# Patient Record
Sex: Male | Born: 1960 | Race: White | Hispanic: No | Marital: Married | Smoking: Never smoker
Health system: Southern US, Community
[De-identification: ages and names within clinical notes are randomized; demographics above are authoritative.]

## PROBLEM LIST (undated history)

## (undated) DIAGNOSIS — E119 Type 2 diabetes mellitus without complications: Secondary | ICD-10-CM

## (undated) DIAGNOSIS — T8859XA Other complications of anesthesia, initial encounter: Secondary | ICD-10-CM

## (undated) DIAGNOSIS — N183 Chronic kidney disease, stage 3 unspecified: Secondary | ICD-10-CM

## (undated) DIAGNOSIS — K7460 Unspecified cirrhosis of liver: Secondary | ICD-10-CM

## (undated) DIAGNOSIS — K219 Gastro-esophageal reflux disease without esophagitis: Secondary | ICD-10-CM

## (undated) DIAGNOSIS — I10 Essential (primary) hypertension: Secondary | ICD-10-CM

## (undated) HISTORY — PX: GALLBLADDER SURGERY: SHX652

## (undated) HISTORY — PX: CLUB FOOT RELEASE: SHX1363

## (undated) HISTORY — DX: Essential (primary) hypertension: I10

## (undated) HISTORY — DX: Gastro-esophageal reflux disease without esophagitis: K21.9

---

## 2004-12-27 ENCOUNTER — Emergency Department: Payer: Self-pay | Admitting: Emergency Medicine

## 2005-01-02 ENCOUNTER — Emergency Department: Payer: Self-pay | Admitting: Emergency Medicine

## 2007-02-10 ENCOUNTER — Emergency Department: Payer: Self-pay | Admitting: Emergency Medicine

## 2007-04-01 ENCOUNTER — Ambulatory Visit: Payer: Self-pay | Admitting: Internal Medicine

## 2009-10-28 ENCOUNTER — Emergency Department: Payer: Self-pay | Admitting: Emergency Medicine

## 2010-11-10 ENCOUNTER — Emergency Department: Payer: Self-pay | Admitting: Emergency Medicine

## 2013-01-31 ENCOUNTER — Ambulatory Visit: Payer: Self-pay | Admitting: Internal Medicine

## 2013-10-03 ENCOUNTER — Emergency Department: Payer: Self-pay | Admitting: Emergency Medicine

## 2013-10-03 LAB — BASIC METABOLIC PANEL
Anion Gap: 5 — ABNORMAL LOW (ref 7–16)
BUN: 20 mg/dL — ABNORMAL HIGH (ref 7–18)
Calcium, Total: 9.2 mg/dL (ref 8.5–10.1)
Chloride: 107 mmol/L (ref 98–107)
Co2: 28 mmol/L (ref 21–32)
Creatinine: 0.79 mg/dL (ref 0.60–1.30)
EGFR (Non-African Amer.): >60
Glucose: 93 mg/dL (ref 65–99)
Osmolality: 282 (ref 275–301)
Potassium: 3.6 mmol/L (ref 3.5–5.1)
SODIUM: 140 mmol/L (ref 136–145)

## 2013-10-03 LAB — CBC
HCT: 46.9 % (ref 40.0–52.0)
HGB: 16.1 g/dL (ref 13.0–18.0)
MCH: 29.1 pg (ref 26.0–34.0)
MCHC: 34.3 g/dL (ref 32.0–36.0)
MCV: 85 fL (ref 80–100)
PLATELETS: 178 10*3/uL (ref 150–440)
RBC: 5.52 10*6/uL (ref 4.40–5.90)
RDW: 12.8 % (ref 11.5–14.5)
WBC: 8 10*3/uL (ref 3.8–10.6)

## 2013-10-03 LAB — TROPONIN I: Troponin-I: <0.02 ng/mL

## 2013-11-10 ENCOUNTER — Ambulatory Visit: Payer: Self-pay | Admitting: Oncology

## 2013-11-28 ENCOUNTER — Ambulatory Visit: Payer: Self-pay | Admitting: Oncology

## 2013-12-07 ENCOUNTER — Ambulatory Visit: Payer: Self-pay | Admitting: Oncology

## 2013-12-12 ENCOUNTER — Ambulatory Visit: Payer: Self-pay | Admitting: Oncology

## 2013-12-28 ENCOUNTER — Ambulatory Visit: Payer: Self-pay | Admitting: Oncology

## 2014-07-05 ENCOUNTER — Ambulatory Visit: Payer: Self-pay | Admitting: Oncology

## 2014-07-05 LAB — CBC CANCER CENTER
Basophil #: 0 x10 3/mm (ref 0.0–0.1)
Basophil %: 0.7 %
EOS ABS: 0.2 x10 3/mm (ref 0.0–0.7)
Eosinophil %: 2.7 %
HCT: 47.1 % (ref 40.0–52.0)
HGB: 16.1 g/dL (ref 13.0–18.0)
LYMPHS PCT: 28 %
Lymphocyte #: 1.8 x10 3/mm (ref 1.0–3.6)
MCH: 29 pg (ref 26.0–34.0)
MCHC: 34.2 g/dL (ref 32.0–36.0)
MCV: 85 fL (ref 80–100)
Monocyte #: 0.6 x10 3/mm (ref 0.2–1.0)
Monocyte %: 9.1 %
NEUTROS ABS: 3.9 x10 3/mm (ref 1.4–6.5)
Neutrophil %: 59.5 %
Platelet: 199 x10 3/mm (ref 150–440)
RBC: 5.56 10*6/uL (ref 4.40–5.90)
RDW: 13.1 % (ref 11.5–14.5)
WBC: 6.5 x10 3/mm (ref 3.8–10.6)

## 2014-07-05 LAB — COMPREHENSIVE METABOLIC PANEL
ALBUMIN: 4.3 g/dL (ref 3.4–5.0)
ALK PHOS: 65 U/L
ANION GAP: 9 (ref 7–16)
AST: 23 U/L (ref 15–37)
BUN: 14 mg/dL (ref 7–18)
Bilirubin,Total: 0.8 mg/dL (ref 0.2–1.0)
Calcium, Total: 9.7 mg/dL (ref 8.5–10.1)
Chloride: 103 mmol/L (ref 98–107)
Co2: 31 mmol/L (ref 21–32)
Creatinine: 1.04 mg/dL (ref 0.60–1.30)
EGFR (African American): >60
EGFR (Non-African Amer.): >60
GLUCOSE: 91 mg/dL (ref 65–99)
Osmolality: 285 (ref 275–301)
POTASSIUM: 4.4 mmol/L (ref 3.5–5.1)
SGPT (ALT): 43 U/L
Sodium: 143 mmol/L (ref 136–145)
Total Protein: 7.6 g/dL (ref 6.4–8.2)

## 2014-07-31 ENCOUNTER — Ambulatory Visit: Payer: Self-pay | Admitting: Oncology

## 2014-12-29 ENCOUNTER — Other Ambulatory Visit: Payer: Self-pay | Admitting: *Deleted

## 2014-12-29 DIAGNOSIS — D3A00 Benign carcinoid tumor of unspecified site: Secondary | ICD-10-CM

## 2015-01-04 ENCOUNTER — Other Ambulatory Visit: Payer: Self-pay

## 2015-01-04 ENCOUNTER — Ambulatory Visit: Payer: Self-pay | Admitting: Oncology

## 2015-01-18 ENCOUNTER — Encounter: Payer: Self-pay | Admitting: Oncology

## 2015-01-18 ENCOUNTER — Inpatient Hospital Stay (HOSPITAL_BASED_OUTPATIENT_CLINIC_OR_DEPARTMENT_OTHER): Payer: BC Managed Care – PPO | Admitting: Oncology

## 2015-01-18 ENCOUNTER — Inpatient Hospital Stay: Payer: BC Managed Care – PPO | Attending: Oncology

## 2015-01-18 VITALS — BP 134/84 | HR 60 | Temp 95.9°F | Wt 242.5 lb

## 2015-01-18 DIAGNOSIS — D3A8 Other benign neuroendocrine tumors: Secondary | ICD-10-CM

## 2015-01-18 DIAGNOSIS — D489 Neoplasm of uncertain behavior, unspecified: Secondary | ICD-10-CM | POA: Insufficient documentation

## 2015-01-18 DIAGNOSIS — D3A00 Benign carcinoid tumor of unspecified site: Secondary | ICD-10-CM

## 2015-01-18 LAB — CBC WITH DIFFERENTIAL/PLATELET
Basophils Absolute: 0 10*3/uL (ref 0–0.1)
Basophils Relative: 1 %
EOS ABS: 0.2 10*3/uL (ref 0–0.7)
Eosinophils Relative: 3 %
HCT: 44.9 % (ref 40.0–52.0)
HEMOGLOBIN: 15.5 g/dL (ref 13.0–18.0)
Lymphocytes Relative: 31 %
Lymphs Abs: 1.8 10*3/uL (ref 1.0–3.6)
MCH: 28.9 pg (ref 26.0–34.0)
MCHC: 34.5 g/dL (ref 32.0–36.0)
MCV: 83.7 fL (ref 80.0–100.0)
MONOS PCT: 10 %
Monocytes Absolute: 0.6 10*3/uL (ref 0.2–1.0)
Neutro Abs: 3.3 10*3/uL (ref 1.4–6.5)
Neutrophils Relative %: 55 %
Platelets: 179 10*3/uL (ref 150–440)
RBC: 5.36 MIL/uL (ref 4.40–5.90)
RDW: 13.3 % (ref 11.5–14.5)
WBC: 5.8 10*3/uL (ref 3.8–10.6)

## 2015-01-18 LAB — COMPREHENSIVE METABOLIC PANEL WITH GFR
ALT: 32 U/L (ref 17–63)
AST: 28 U/L (ref 15–41)
Albumin: 4.6 g/dL (ref 3.5–5.0)
Alkaline Phosphatase: 54 U/L (ref 38–126)
Anion gap: 7 (ref 5–15)
BUN: 23 mg/dL — ABNORMAL HIGH (ref 6–20)
CO2: 26 mmol/L (ref 22–32)
Calcium: 9 mg/dL (ref 8.9–10.3)
Chloride: 104 mmol/L (ref 101–111)
Creatinine, Ser: 0.91 mg/dL (ref 0.61–1.24)
GFR calc Af Amer: >60 mL/min
GFR calc non Af Amer: >60 mL/min
Glucose, Bld: 97 mg/dL (ref 65–99)
Potassium: 3.9 mmol/L (ref 3.5–5.1)
Sodium: 137 mmol/L (ref 135–145)
Total Bilirubin: 0.7 mg/dL (ref 0.3–1.2)
Total Protein: 7.5 g/dL (ref 6.5–8.1)

## 2015-01-18 NOTE — Progress Notes (Signed)
Patient does not have living will.  Information given.  Never smoked.

## 2015-01-19 ENCOUNTER — Encounter: Payer: Self-pay | Admitting: Oncology

## 2015-01-19 DIAGNOSIS — D3A8 Other benign neuroendocrine tumors: Secondary | ICD-10-CM | POA: Insufficient documentation

## 2015-01-19 DIAGNOSIS — I10 Essential (primary) hypertension: Secondary | ICD-10-CM | POA: Insufficient documentation

## 2015-01-19 DIAGNOSIS — K7460 Unspecified cirrhosis of liver: Secondary | ICD-10-CM | POA: Insufficient documentation

## 2015-01-19 DIAGNOSIS — E785 Hyperlipidemia, unspecified: Secondary | ICD-10-CM | POA: Insufficient documentation

## 2015-01-19 DIAGNOSIS — T782XXA Anaphylactic shock, unspecified, initial encounter: Secondary | ICD-10-CM | POA: Insufficient documentation

## 2015-01-19 NOTE — Progress Notes (Signed)
[place] @ [facility] [phone]  Fax:([phone]) [place]: 08/22/1960  MR#: [id number]  SWH#:[id number]  Patient Care Team: Adin Hector, MD as PCP - General (Internal Medicine)  CHIEF COMPLAINT:  Chief Complaint  Patient presents with  . Follow-up     No history exists.   1.neuroendocrine tumor, well  differentiated. 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  Patient is getting regular upper endoscopy done.  No significant new symptoms.   REVIEW OF SYSTEMS:   GENERAL:  Feels good.  Active.  No fevers, sweats or weight loss. PERFORMANCE STATUS (ECOG): 0 HEENT:  No visual changes, runny nose, sore throat, mouth sores or tenderness. Lungs: No shortness of breath or cough.  No hemoptysis. Cardiac:  No chest pain, palpitations, orthopnea, or PND. GI:  No nausea, vomiting, diarrhea, constipation, melena or hematochezia. GU:  No urgency, frequency, dysuria, or hematuria. Musculoskeletal:  No back pain.  No joint pain.  No muscle tenderness. Extremities:  No pain or swelling. Skin:  No rashes or skin changes. Neuro:  No headache, numbness or weakness, balance or coordination issues. Endocrine:  No diabetes, thyroid issues, hot flashes or night sweats. Psych:  No mood changes, depression or anxiety. Pain:  No focal pain. Review of systems:  All other systems reviewed and found to be negative. As per HPI. Otherwise, a complete review of systems is negatve.   Allergies:  Prednisone: Unknown  Tetracycline: Unknown  Bee Stings: Unknown  Significant History/PMH:   Hypertension:    Foot Surgery - Left: REPAIR OF CLUB FOOT AS CHILD   Cholecystectomy:   Smoking History: Smoking History Never Smoked.(1)  PFSH: Comments: aunt had breast cancer  . father had lung cancer  Social History: negative  alcohol, negative tobacco  Comments: He is a Theme park manager in a [employer]  Additional Past Medical and Surgical History: As mentioned above   No family history on file.  ADVANCED DIRECTIVES:  Patient does not have any living will or healthcare power of attorney.  Information was given .  Available resources had been discussed.  We will follow-up on subsequent appointments regarding this issue HEALTH MAINTENANCE: History  Substance Use Topics  . Smoking status: Never Smoker   . Smokeless tobacco: Not on file  . Alcohol Use: Not on file      Allergies  Allergen Reactions  . Prednisone Other (See Comments)  . Tetracycline Other (See Comments)    Current Outpatient Prescriptions  Medication Sig Dispense Refill  . hydrochlorothiazide (HYDRODIURIL) 25 MG tablet TAKE 1 TABLET BY MOUTH DAILY    . lisinopril (PRINIVIL,ZESTRIL) 10 MG tablet TAKE 1 TABLET BY MOUTH EVERY DAY    . pantoprazole (PROTONIX) 40 MG tablet TAKE ONE TABLET EVERY DAY     No current facility-administered medications for this visit.    OBJECTIVE:  Filed Vitals:   01/18/15 1046  BP: 134/84  Pulse: 60  Temp: 95.9 F (35.5 C)     There is no height on file to calculate BMI.    ECOG FS:0 - Asymptomatic  PHYSICAL EXAM: General status: Performance status is good.  Patient has not lost significant weight HEENT: No evidence of stomatitis. Sclera and conjunctivae :: No jaundice.   pale looking. Lungs: Air  entry equal on both sides.  No rhonchi.  No rales.  Cardiac: Heart sounds  are normal.  No pericardial rub.  No murmur. Lymphatic system: Cervical, axillary, inguinal, lymph nodes not palpable GI: Abdomen is soft.  No ascites.  Liver spleen not palpable.  No tenderness.  Bowel sounds are within normal limit Lower extremity: No edema Neurological system: Higher functions, cranial nerves intact no evidence of peripheral neuropathy. Skin: No rash.  No ecchymosis.Marland Kitchen   LAB RESULTS:  Appointment on 01/18/2015    Component Date Value Ref Range Status  . WBC 01/18/2015 5.8  3.8 - 10.6 K/uL Final  . RBC 01/18/2015 5.36  4.40 - 5.90 MIL/uL Final  . Hemoglobin 01/18/2015 15.5  13.0 - 18.0 g/dL Final  . HCT 01/18/2015 44.9  40.0 - 52.0 % Final  . MCV 01/18/2015 83.7  80.0 - 100.0 fL Final  . MCH 01/18/2015 28.9  26.0 - 34.0 pg Final  . MCHC 01/18/2015 34.5  32.0 - 36.0 g/dL Final  . RDW 01/18/2015 13.3  11.5 - 14.5 % Final  . Platelets 01/18/2015 179  150 - 440 K/uL Final  . Neutrophils Relative % 01/18/2015 55   Final  . Neutro Abs 01/18/2015 3.3  1.4 - 6.5 K/uL Final  . Lymphocytes Relative 01/18/2015 31   Final  . Lymphs Abs 01/18/2015 1.8  1.0 - 3.6 K/uL Final  . Monocytes Relative 01/18/2015 10   Final  . Monocytes Absolute 01/18/2015 0.6  0.2 - 1.0 K/uL Final  . Eosinophils Relative 01/18/2015 3   Final  . Eosinophils Absolute 01/18/2015 0.2  0 - 0.7 K/uL Final  . Basophils Relative 01/18/2015 1   Final  . Basophils Absolute 01/18/2015 0.0  0 - 0.1 K/uL Final  . Sodium 01/18/2015 137  135 - 145 mmol/L Final  . Potassium 01/18/2015 3.9  3.5 - 5.1 mmol/L Final  . Chloride 01/18/2015 104  101 - 111 mmol/L Final  . CO2 01/18/2015 26  22 - 32 mmol/L Final  . Glucose, Bld 01/18/2015 97  65 - 99 mg/dL Final  . BUN 01/18/2015 23* 6 - 20 mg/dL Final  . Creatinine, Ser 01/18/2015 0.91  0.61 - 1.24 mg/dL Final  . Calcium 01/18/2015 9.0  8.9 - 10.3 mg/dL Final  . Total Protein 01/18/2015 7.5  6.5 - 8.1 g/dL Final  . Albumin 01/18/2015 4.6  3.5 - 5.0 g/dL Final  . AST 01/18/2015 28  15 - 41 U/L Final  . ALT 01/18/2015 32  17 - 63 U/L Final  . Alkaline Phosphatase 01/18/2015 54  38 - 126 U/L Final  . Total Bilirubin 01/18/2015 0.7  0.3 - 1.2 mg/dL Final  . GFR calc non Af Amer 01/18/2015 >60  >60 mL/min Final  . GFR calc Af Amer 01/18/2015 >60  >60 mL/min Final   Comment: (NOTE) The eGFR has been calculated using the CKD EPI equation. This calculation has not been validated in all clinical  situations. eGFR's persistently <60 mL/min signify possible Chronic Kidney Disease.   . Anion gap 01/18/2015 7  5 - 15 Final       ASSESSMENT: Stage I neuroendocrine tumor.  Without any carcinoid syndrome symptoms  MEDICAL DECISION MAKING:  All lab data is within normal limits.  At this point in time considering very early stage of looking tumor patient does not require any further imaging or specific blood studies or urine studies unless patient develops any carcinoid syndrome. Patient would be followed regularly by primary care physician and will be discharged from our care  Patient expressed understanding and was in agreement with  this plan. He also understands that He can call clinic at any time with any questions, concerns, or complaints.    Neuroendocrine tumor   Staging form: Colon and Rectum, AJCC 7th Edition     Clinical: Stage I (T1, N0, M0) - Signed by Forest Gleason, MD on 01/19/2015   Forest Gleason, MD   01/19/2015 2:51 PM

## 2015-11-19 DIAGNOSIS — G8929 Other chronic pain: Secondary | ICD-10-CM | POA: Insufficient documentation

## 2015-11-19 DIAGNOSIS — M79671 Pain in right foot: Secondary | ICD-10-CM

## 2016-02-13 DIAGNOSIS — R739 Hyperglycemia, unspecified: Secondary | ICD-10-CM | POA: Insufficient documentation

## 2016-05-30 ENCOUNTER — Ambulatory Visit (INDEPENDENT_AMBULATORY_CARE_PROVIDER_SITE_OTHER): Payer: BC Managed Care – PPO | Admitting: Urology

## 2016-05-30 ENCOUNTER — Other Ambulatory Visit: Payer: Self-pay | Admitting: Family Medicine

## 2016-05-30 ENCOUNTER — Encounter: Payer: Self-pay | Admitting: Urology

## 2016-05-30 VITALS — BP 129/79 | HR 77 | Ht 72.0 in | Wt 242.0 lb

## 2016-05-30 DIAGNOSIS — R972 Elevated prostate specific antigen [PSA]: Secondary | ICD-10-CM

## 2016-05-30 DIAGNOSIS — R350 Frequency of micturition: Secondary | ICD-10-CM

## 2016-05-30 NOTE — Progress Notes (Signed)
05/30/2016 3:03 PM   [place] [birth year] [id number]  Referring provider: Adin Hector, MD [place], [place] [address]  Chief Complaint  Patient presents with  . Elevated PSA    HPI: 55 year old [place] referred for further evaluation of elevated PSA. His most recent PSA  was 4.03 on 05/08/2016.  PSA trend is below: 4.03 05/18/2016 3.27 08/09/2015 4.0  02/01/2015 3.11 01/18/2014  No family history of prostate cancer.  He denies any baseline urinary symptoms other than urinary frequency which started just after starting lisinopril and hydrochlorothiazide. He denies any other significant urinary symptoms including no dysuria, gross hematuria, or UTIs.  PMHx significant for HTN, neuroendocrine tumor of the duodenal bulb.        IPSS    Row Name 05/30/16 1400         International Prostate Symptom Score   How often have you had the sensation of not emptying your bladder? Not at All     How often have you had to urinate less than every two hours? More than half the time     How often have you found you stopped and started again several times when you urinated? Not at All     How often have you found it difficult to postpone urination? Less than half the time     How often have you had a weak urinary stream? Not at All     How often have you had to strain to start urination? Not at All     How many times did you typically get up at night to urinate? 1 Time     Total IPSS Score 7       Quality of Life due to urinary symptoms   If you were to spend the rest of your life with your urinary condition just the way it is now how would you feel about that? Pleased        Score:  1-7 Mild 8-19 Moderate 20-35 Severe   PMH: Past Medical History:  Diagnosis Date  . GERD (gastroesophageal reflux disease)   . Hypertension     Surgical History: Past Surgical History:  Procedure Laterality Date  . CLUB FOOT RELEASE    .  GALLBLADDER SURGERY      Home Medications:    Medication List       Accurate as of 05/30/16  3:03 PM. Always use your most recent med list.          hydrochlorothiazide 25 MG tablet Commonly known as:  HYDRODIURIL TAKE 1 TABLET BY MOUTH DAILY   lisinopril 10 MG tablet Commonly known as:  PRINIVIL,ZESTRIL TAKE 1 TABLET BY MOUTH EVERY DAY   pantoprazole 40 MG tablet Commonly known as:  PROTONIX TAKE ONE TABLET EVERY DAY       Allergies:  Allergies  Allergen Reactions  . Prednisone Other (See Comments)  . Tetracycline Other (See Comments)    Family History: Family History  Problem Relation Age of Onset  . Prostate cancer Neg Hx   . Bladder Cancer Neg Hx   . Kidney cancer Neg Hx     Social History:  reports that he has never smoked. He has never used smokeless tobacco. He reports that he does not drink alcohol or use drugs.  ROS: UROLOGY Frequent Urination?: Yes Hard to postpone urination?: No Burning/pain with urination?: No Get up at night to urinate?: Yes Leakage of urine?: No Urine stream starts and  stops?: No Trouble starting stream?: No Do you have to strain to urinate?: No Blood in urine?: No Urinary tract infection?: No Sexually transmitted disease?: No Injury to kidneys or bladder?: No Painful intercourse?: No Weak stream?: No Erection problems?: No Penile pain?: No  Gastrointestinal Nausea?: No Vomiting?: No Indigestion/heartburn?: Yes Diarrhea?: No Constipation?: No  Constitutional Fever: No Night sweats?: No Weight loss?: No Fatigue?: No  Skin Skin rash/lesions?: No Itching?: No  Eyes Blurred vision?: No Double vision?: No  Ears/Nose/Throat Sore throat?: No Sinus problems?: No  Hematologic/Lymphatic Swollen glands?: No Easy bruising?: No  Cardiovascular Leg swelling?: No Chest pain?: No  Respiratory Cough?: No Shortness of breath?: No  Endocrine Excessive thirst?: No  Musculoskeletal Back pain?: No Joint  pain?: No  Neurological Headaches?: No Dizziness?: No  Psychologic Depression?: No Anxiety?: No  Physical Exam: BP 129/79   Pulse 77   Ht 6' (1.829 m)   Wt 242 lb (109.8 kg)   BMI 32.82 kg/m   Constitutional:  Alert and oriented, No acute distress. HEENT: [place] AT, moist mucus membranes.  Trachea midline, no masses. Cardiovascular: No clubbing, cyanosis, or edema. Respiratory: Normal respiratory effort, no increased work of breathing. GI: Abdomen is soft, nontender, nondistended, no abdominal masses GU: No CVA tenderness.  Rectal: Normal sphincter tone. Enlarged prostate with a rubbery lateral lobes, 50+ cc gland. Skin: No rashes, bruises or suspicious lesions. Neurologic: Grossly intact, no focal deficits, moving all 4 extremities. Psychiatric: Normal mood and affect.  Laboratory Data: Lab Results  Component Value Date   WBC 5.8 01/18/2015   HGB 15.5 01/18/2015   HCT 44.9 01/18/2015   MCV 83.7 01/18/2015   PLT 179 01/18/2015    Lab Results  Component Value Date   CREATININE 0.91 01/18/2015    PSA as above  Urinalysis No results found for: COLORURINE, APPEARANCEUR, LABSPEC, PHURINE, GLUCOSEU, HGBUR, BILIRUBINUR, KETONESUR, PROTEINUR, UROBILINOGEN, NITRITE, LEUKOCYTESUR  Pertinent Imaging: n/a  Assessment & Plan:    1. Elevated PSA  We reviewed the implications of an elevated PSA and the uncertainty surrounding it. In general, a man's PSA increases with age and is produced by both normal and cancerous prostate tissue. Differential for elevated PSA is BPH, prostate cancer, infection, recent intercourse/ejaculation, prostate infarction, recent urethroscopic manipulation (foley placement/cystoscopy) and prostatitis. Management of an elevated PSA can include observation or prostate biopsy and wediscussed this in detail.  We discussed that indications for prostate biopsy are defined by age and race specific PSA cutoffs as well as a PSA velocity of 0.75/year.  Overall,  his PSA is somewhat more elevated than I would expect for his age and ethnicity. Trend is somewhat up-and-down, relatively stable overall.  Based on his gland size on exam, this may be attributed to an enlarged prostate. I have recommended repeat PSA with free PSA today.  We discussed the possibility of proceeding with prostate biopsy. Alternatively, consider continuing to follow his lab values closely, repeat PSA/DRE in 6 months which he would prefer. He is agreeable with this plan.   - PSA, total and free  2. Urinary frequency Mild daytime urinary frequency related to medications   Return in about 6 months (around 11/28/2016) for PSA/ DRE.  Hollice Espy, MD  [facility] [address] [place], [place] [address] [phone]

## 2016-06-02 ENCOUNTER — Other Ambulatory Visit: Payer: Self-pay

## 2016-06-02 ENCOUNTER — Other Ambulatory Visit
Admission: RE | Admit: 2016-06-02 | Discharge: 2016-06-02 | Disposition: A | Discharge status: Home / Self Care | Payer: BC Managed Care – PPO | Source: Ambulatory Visit | Attending: Urology | Admitting: Urology

## 2016-06-02 DIAGNOSIS — R972 Elevated prostate specific antigen [PSA]: Secondary | ICD-10-CM

## 2016-06-03 ENCOUNTER — Telehealth: Payer: Self-pay

## 2016-06-03 LAB — PSA (REFLEX TO FREE) (SERIAL): Prostate Specific Ag, Serum: 3.8 ng/mL (ref 0.0–4.0)

## 2016-06-03 NOTE — Telephone Encounter (Signed)
-----   Message from Hollice Espy, MD sent at 06/03/2016  1:12 PM EST ----- PSA down to 3.8.  See you in 6 months.    Hollice Espy, MD

## 2016-06-03 NOTE — Telephone Encounter (Signed)
Spoke with pt in reference to PSA results. Pt voiced understanding.  

## 2016-11-10 ENCOUNTER — Telehealth: Payer: Self-pay | Admitting: Urology

## 2016-11-10 NOTE — Telephone Encounter (Signed)
Called patient to move his app in [place] on 07-31-16 because you are not seeing patient's in [place] that day.I offered him an app in [place] but he declined. Patient said that he was told to come back after he had a follow up app with Dr. Caryl Comes and that is not until 11-18-16. He did not want to schedule another app right now. He said he would call back later to schedule after he has this app.  Thanks,  Sharyn Lull

## 2016-11-28 ENCOUNTER — Ambulatory Visit: Payer: BC Managed Care – PPO | Admitting: Urology

## 2017-04-02 ENCOUNTER — Other Ambulatory Visit: Payer: Self-pay

## 2017-04-02 DIAGNOSIS — R972 Elevated prostate specific antigen [PSA]: Secondary | ICD-10-CM

## 2017-04-03 ENCOUNTER — Encounter: Payer: Self-pay | Admitting: Urology

## 2017-04-03 ENCOUNTER — Ambulatory Visit (INDEPENDENT_AMBULATORY_CARE_PROVIDER_SITE_OTHER): Payer: BC Managed Care – PPO | Admitting: Urology

## 2017-04-03 ENCOUNTER — Other Ambulatory Visit
Admission: RE | Admit: 2017-04-03 | Discharge: 2017-04-03 | Disposition: A | Discharge status: Home / Self Care | Payer: BC Managed Care – PPO | Source: Ambulatory Visit | Attending: Urology | Admitting: Urology

## 2017-04-03 VITALS — BP 137/74 | HR 61 | Ht 72.0 in | Wt 240.0 lb

## 2017-04-03 DIAGNOSIS — R972 Elevated prostate specific antigen [PSA]: Secondary | ICD-10-CM

## 2017-04-03 DIAGNOSIS — R350 Frequency of micturition: Secondary | ICD-10-CM

## 2017-04-03 NOTE — Progress Notes (Signed)
04/03/2017 4:13 PM   [place] Mare 19-Jan-1961 [id number]  Referring provider: Adin Hector, MD [place] [facility] [place], [place] [address]  Chief Complaint  Patient presents with  . Elevated PSA    HPI: 56 year old male referred for further evaluation of elevated PSA who returns for routine follow up.  PSA trend is below: 6.52  03/05/2017 4.03 05/18/2016 3.27 08/09/2015 4.0  02/01/2015 3.11 01/18/2014  No family history of prostate cancer.  He denies any baseline urinary symptoms other than urinary frequency which started just after starting lisinopril and hydrochlorothiazide. He denies any other significant urinary symptoms including no dysuria, gross hematuria, or UTIs.  He reports having issues with discomfort int he penile and scrotal area in July and again over the past few weeks.    PMHx significant for HTN, neuroendocrine tumor of the duodenal bulb.     PMH: Past Medical History:  Diagnosis Date  . GERD (gastroesophageal reflux disease)   . Hypertension     Surgical History: Past Surgical History:  Procedure Laterality Date  . CLUB FOOT RELEASE    . GALLBLADDER SURGERY      Home Medications:  Allergies as of 04/03/2017      Reactions   Prednisone Other (See Comments)   Tetracycline Other (See Comments)      Medication List       Accurate as of 04/03/17 11:59 PM. Always use your most recent med list.          hydrochlorothiazide 25 MG tablet Commonly known as:  HYDRODIURIL TAKE 1 TABLET BY MOUTH DAILY   lisinopril 10 MG tablet Commonly known as:  PRINIVIL,ZESTRIL TAKE 1 TABLET BY MOUTH EVERY DAY   pantoprazole 40 MG tablet Commonly known as:  PROTONIX TAKE ONE TABLET EVERY DAY       Allergies:  Allergies  Allergen Reactions  . Prednisone Other (See Comments)  . Tetracycline Other (See Comments)    Family History: Family History  Problem Relation Age of Onset  . Prostate cancer Neg Hx   . Bladder  Cancer Neg Hx   . Kidney cancer Neg Hx     Social History:  reports that he has never smoked. He has never used smokeless tobacco. He reports that he does not drink alcohol or use drugs.  ROS: UROLOGY Frequent Urination?: No Hard to postpone urination?: No Burning/pain with urination?: No Get up at night to urinate?: No Leakage of urine?: No Urine stream starts and stops?: No Trouble starting stream?: No Do you have to strain to urinate?: No Blood in urine?: No Urinary tract infection?: No Sexually transmitted disease?: No Injury to kidneys or bladder?: No Painful intercourse?: No Weak stream?: No Erection problems?: No Penile pain?: Yes  Gastrointestinal Nausea?: No Vomiting?: No Indigestion/heartburn?: No Diarrhea?: No Constipation?: No  Constitutional Fever: No Night sweats?: No Weight loss?: No Fatigue?: No  Skin Skin rash/lesions?: No Itching?: No  Eyes Blurred vision?: No Double vision?: No  Ears/Nose/Throat Sore throat?: No Sinus problems?: No  Hematologic/Lymphatic Swollen glands?: No Easy bruising?: No  Cardiovascular Leg swelling?: No Chest pain?: No  Respiratory Cough?: No Shortness of breath?: No  Endocrine Excessive thirst?: No  Musculoskeletal Back pain?: Yes Joint pain?: No  Neurological Headaches?: No Dizziness?: No  Psychologic Depression?: No Anxiety?: No  Physical Exam: BP 137/74   Pulse 61   Ht 6' (1.829 m)   Wt 240 lb (108.9 kg)   BMI 32.55 kg/m   Constitutional:  Alert  and oriented, No acute distress. HEENT: [place] AT, moist mucus membranes.  Trachea midline, no masses. Cardiovascular: No clubbing, cyanosis, or edema. Respiratory: Normal respiratory effort, no increased work of breathing. GI: Abdomen is soft, nontender, nondistended, no abdominal masses GU: No CVA tenderness.  Rectal: Normal sphincter tone. Enlarged prostate with a rubbery lateral lobes, 50+ cc gland. Skin: No rashes, bruises or suspicious  lesions. Neurologic: Grossly intact, no focal deficits, moving all 4 extremities. Psychiatric: Normal mood and affect.  Laboratory Data: Lab Results  Component Value Date   WBC 5.8 01/18/2015   HGB 15.5 01/18/2015   HCT 44.9 01/18/2015   MCV 83.7 01/18/2015   PLT 179 01/18/2015    Lab Results  Component Value Date   CREATININE 0.91 01/18/2015    PSA as above  Urinalysis N/a  Pertinent Imaging: n/a  Assessment & Plan:    1. Elevated PSA Repeat PSA ordered today given that he was having symptoms around the time of his most recent PSA and may reflect PSA elevation related to inflammation.  Will call with results and plan based on labs  Given his age, low threshold to pursue biopsy if PSA continues to rise. Patient understands and is agreeable with this plan.  - PSA, total and free  2. Urinary frequency Mild daytime urinary frequency related to medications  Will call with PSA results to devise plan.  Hollice Espy, MD  [facility] [place], [place] [address] [phone]

## 2017-04-04 LAB — PSA (REFLEX TO FREE) (SERIAL): PROSTATE SPECIFIC AG, SERUM: 4.8 ng/mL — AB (ref 0.0–4.0)

## 2017-04-04 LAB — FPSA% REFLEX
% FREE PSA: 8.5 %
PSA, FREE: 0.41 ng/mL

## 2017-04-06 ENCOUNTER — Telehealth: Payer: Self-pay | Admitting: Urology

## 2017-04-06 NOTE — Telephone Encounter (Signed)
Pt returned your call.  Please call him on his cell# [phone].

## 2017-04-06 NOTE — Telephone Encounter (Signed)
Patient to discuss PSA results. Able to leave a message on the machine outlining plan.  PSA down to 4.8, but overall, trend is concerning and additionally, free PSA is quite low putting him at a high risk category. As such, I have recommended proceeding biopsy which we discussed in the office.  Please mail him biopsy instruction sheet and scheduled biopsy.  Hollice Espy, MD

## 2017-04-08 NOTE — Telephone Encounter (Signed)
Yes, discussed at length.  Please mail him instruction sheet as well.    Hollice Espy, MD

## 2017-04-08 NOTE — Telephone Encounter (Signed)
Before I call him to schedule this were you able to reach him and discuss results?   Thanks, Sharyn Lull

## 2017-04-09 ENCOUNTER — Telehealth: Payer: Self-pay

## 2017-04-09 MED ORDER — DIAZEPAM 10 MG PO TABS: 10.0000 mg | ORAL_TABLET | Freq: Once | ORAL | 0 refills | Status: AC

## 2017-04-09 NOTE — Telephone Encounter (Signed)
Spoke with patient and went over biopsy instructions over the phone mailed them and  script for valium.  Sharyn Lull

## 2017-04-09 NOTE — Telephone Encounter (Signed)
Patient requesting a Valium for his upcoming prostate biopsy, instructions were discussed with patient.

## 2017-05-01 ENCOUNTER — Other Ambulatory Visit: Payer: Self-pay | Admitting: Urology

## 2017-05-01 ENCOUNTER — Ambulatory Visit (INDEPENDENT_AMBULATORY_CARE_PROVIDER_SITE_OTHER): Payer: BC Managed Care – PPO | Admitting: Urology

## 2017-05-01 ENCOUNTER — Encounter: Payer: Self-pay | Admitting: Urology

## 2017-05-01 VITALS — BP 144/94 | HR 61 | Ht 72.0 in | Wt 241.0 lb

## 2017-05-01 DIAGNOSIS — R972 Elevated prostate specific antigen [PSA]: Secondary | ICD-10-CM

## 2017-05-01 MED ORDER — LEVOFLOXACIN 500 MG PO TABS
500.0000 mg | ORAL_TABLET | Freq: Once | ORAL | Status: AC
  Administered 2017-05-01: 500 mg via ORAL

## 2017-05-01 MED ORDER — LIDOCAINE HCL 2 % EX GEL
1.0000 "application " | Freq: Once | CUTANEOUS | Status: AC
  Administered 2017-05-01: 1 via URETHRAL

## 2017-05-01 MED ORDER — GENTAMICIN SULFATE 40 MG/ML IJ SOLN
80.0000 mg | Freq: Once | INTRAMUSCULAR | Status: AC
  Administered 2017-05-01: 80 mg via INTRAMUSCULAR

## 2017-05-01 NOTE — Progress Notes (Signed)
   05/01/17  CC:  Chief Complaint  Patient presents with  . Biopsy    HPI: 56 year old male with elevated PSA who presents today for prostate biopsy.  Blood pressure (!) 144/94, pulse 61, height 6' (1.829 m), weight 241 lb (109.3 kg). NED. A&Ox3.   No respiratory distress   Abd soft, NT, ND Normal sphincter tone  Prostate Biopsy Procedure   Informed consent was obtained after discussing risks/benefits of the procedure.  A time out was performed to ensure correct patient identity.  Pre-Procedure: - Gentamicin given prophylactically - Levaquin 500 mg administered PO -Transrectal Ultrasound performed revealing a 46.8 gm prostate -Small amount of intravesical protrusion.  Multiple calcification throughout the entire bladder.  Very small hypoechoic lesion at right lateral base.  Procedure: - Prostate block performed using 10 cc 1% lidocaine and biopsies taken from sextant areas, a total of 12 under ultrasound guidance.  Post-Procedure: - Patient tolerated the procedure well - He was counseled to seek immediate medical attention if experiences any severe pain, significant bleeding, or fevers - Return in one week to discuss biopsy results  Assessment/ Plan:   1. Elevated PSA Status post uncomplicated biopsy - gentamicin (GARAMYCIN) injection 80 mg; Inject 2 mLs (80 mg total) into the muscle once. - levofloxacin (LEVAQUIN) tablet 500 mg; Take 1 tablet (500 mg total) by mouth once. - lidocaine (XYLOCAINE) 2 % jelly 1 application; Place 1 application into the urethra once.  F/u as scheduled in 2 weeks  Hollice Espy, MD

## 2017-05-06 ENCOUNTER — Other Ambulatory Visit: Payer: Self-pay | Admitting: Urology

## 2017-05-06 LAB — PATHOLOGY REPORT

## 2017-05-08 ENCOUNTER — Telehealth: Payer: Self-pay

## 2017-05-08 DIAGNOSIS — R972 Elevated prostate specific antigen [PSA]: Secondary | ICD-10-CM

## 2017-05-08 NOTE — Telephone Encounter (Signed)
-----   Message from Hollice Espy, MD sent at 05/07/2017 11:47 AM EST ----- Awesome news, no evidence of prostate cancer on this biopsy.  There was evidence of inflammation as well as an enlarged prostate which may be the cause for the PSA rise.  I would like to continue to follow you closely, recommend rescheduling follow-up for in 6 months with PSA prior.  Hollice Espy, MD

## 2017-05-08 NOTE — Telephone Encounter (Signed)
Spoke with pt in reference to -bx results and needing a f/u in 62mo with PSA prior. Pt voiced understanding. Lab orders placed. Lab and OV appts made.

## 2017-05-15 ENCOUNTER — Ambulatory Visit: Payer: BC Managed Care – PPO | Admitting: Urology

## 2017-11-09 ENCOUNTER — Other Ambulatory Visit: Payer: BC Managed Care – PPO

## 2017-11-09 DIAGNOSIS — R972 Elevated prostate specific antigen [PSA]: Secondary | ICD-10-CM

## 2017-11-10 ENCOUNTER — Encounter: Payer: Self-pay | Admitting: Urology

## 2017-11-10 ENCOUNTER — Ambulatory Visit: Payer: BC Managed Care – PPO | Admitting: Urology

## 2017-11-10 VITALS — BP 121/80 | HR 67 | Resp 16 | Ht 72.0 in | Wt 241.8 lb

## 2017-11-10 DIAGNOSIS — R972 Elevated prostate specific antigen [PSA]: Secondary | ICD-10-CM

## 2017-11-10 LAB — PSA: PROSTATE SPECIFIC AG, SERUM: 5 ng/mL — AB (ref 0.0–4.0)

## 2017-11-10 NOTE — Progress Notes (Signed)
11/10/2017 4:24 PM   Lynetta Mare 1961/02/27 [id number]  Referring provider: Adin Hector, MD [facility] [place], [place] [address]  Chief Complaint  Patient presents with  . Results    Biopsy    HPI: 57 year old male who returns today 6 months following prostate biopsy for history of elevated PSA.  PSA trend as below.  Most recent PSA 5.0.  Most recent rectal exam 03/2017 unremarkable.    PSA trend is below:  5.0 on 11/09/2017  4.8 03/2017  --> PNBX 03/2017 negative for malignancy, inflammation, TRUS volume 46.8 cc. 6.52 03/05/2017 4.03 05/18/2016  3.27 08/09/2015  4.0 02/01/2015  3.11 01/18/2014   No family history of prostate cancer.   He denies any baseline urinary symptoms other than urinary frequency which started just after starting lisinopril and hydrochlorothiazide. He denies any other significant urinary symptoms including no dysuria, gross hematuria, or UTIs.   This is unchanged from previous visits.  PMHx significant for HTN, neuroendocrine tumor of the duodenal bulb.    PMH: Past Medical History:  Diagnosis Date  . GERD (gastroesophageal reflux disease)   . Hypertension     Surgical History: Past Surgical History:  Procedure Laterality Date  . CLUB FOOT RELEASE    . GALLBLADDER SURGERY      Home Medications:  Allergies as of 11/10/2017      Reactions   Prednisone Other (See Comments)   Tetracycline Other (See Comments)      Medication List        Accurate as of 11/10/17 11:59 PM. Always use your most recent med list.          hydrochlorothiazide 25 MG tablet Commonly known as:  HYDRODIURIL TAKE 1 TABLET BY MOUTH DAILY   lisinopril 10 MG tablet Commonly known as:  PRINIVIL,ZESTRIL TAKE 1 TABLET BY MOUTH EVERY DAY   pantoprazole 40 MG tablet Commonly known as:  PROTONIX TAKE ONE TABLET EVERY DAY       Allergies:  Allergies  Allergen Reactions  . Prednisone Other (See Comments)  . Tetracycline  Other (See Comments)    Family History: Family History  Problem Relation Age of Onset  . Prostate cancer Neg Hx   . Bladder Cancer Neg Hx   . Kidney cancer Neg Hx     Social History:  reports that he has never smoked. He has never used smokeless tobacco. He reports that he does not drink alcohol or use drugs.  ROS: UROLOGY Frequent Urination?: No Hard to postpone urination?: No Burning/pain with urination?: No Get up at night to urinate?: No Leakage of urine?: No Urine stream starts and stops?: No Trouble starting stream?: No Do you have to strain to urinate?: No Blood in urine?: No Urinary tract infection?: No Sexually transmitted disease?: No Injury to kidneys or bladder?: No Painful intercourse?: No Weak stream?: No Erection problems?: No Penile pain?: No  Gastrointestinal Nausea?: No Vomiting?: No Indigestion/heartburn?: No Diarrhea?: No Constipation?: No  Constitutional Fever: No Night sweats?: No Weight loss?: No Fatigue?: No  Skin Skin rash/lesions?: No Itching?: No  Eyes Blurred vision?: No Double vision?: No  Ears/Nose/Throat Sore throat?: No Sinus problems?: No  Hematologic/Lymphatic Swollen glands?: No Easy bruising?: No  Cardiovascular Leg swelling?: No Chest pain?: No  Respiratory Cough?: No Shortness of breath?: No  Endocrine Excessive thirst?: No  Musculoskeletal Back pain?: No Joint pain?: No  Neurological Headaches?: No Dizziness?: No  Psychologic Depression?: No Anxiety?: No  Physical Exam: BP  121/80   Pulse 67   Resp 16   Ht 6' (1.829 m)   Wt 241 lb 12.8 oz (109.7 kg)   SpO2 96%   BMI 32.79 kg/m   Constitutional:  Alert and oriented, No acute distress. HEENT: [place] AT, moist mucus membranes.  Trachea midline, no masses. Cardiovascular: No clubbing, cyanosis, or edema. Respiratory: Normal respiratory effort, no increased work of breathing. GU: No CVA tenderness Skin: No rashes, bruises or suspicious  lesions. Neurologic: Grossly intact, no focal deficits, moving all 4 extremities. Psychiatric: Normal mood and affect.  Laboratory Data: Lab Results  Component Value Date   WBC 5.8 01/18/2015   HGB 15.5 01/18/2015   HCT 44.9 01/18/2015   MCV 83.7 01/18/2015   PLT 179 01/18/2015    Lab Results  Component Value Date   CREATININE 0.91 01/18/2015    Component     Latest Ref Rng & Units 06/02/2016 04/03/2017 11/09/2017  Prostate Specific Ag, Serum     0.0 - 4.0 ng/mL 3.8 4.8 (H) 5.0 (H)    Urinalysis N/a  Pertinent Imaging: N/a  Assessment & Plan:    1. Elevated PSA PSA essentially stable from PSA 6 months ago with negative biopsy We will continue to follow closely Recommend follow-up in 6 months with PSA/DRE In the future, PSA continues to climb, would favor prostate MRI for further evaluation He is agreeable this plan   Return in about 6 months (around 05/13/2018) for PSA/ DRE.  Hollice Espy, MD  [facility] [address], [place],  [address] [phone]

## 2018-04-16 ENCOUNTER — Emergency Department: Payer: BC Managed Care – PPO

## 2018-04-16 ENCOUNTER — Other Ambulatory Visit: Payer: Self-pay

## 2018-04-16 ENCOUNTER — Emergency Department
Admission: EM | Admit: 2018-04-16 | Discharge: 2018-04-16 | Disposition: A | Discharge status: Home / Self Care | Payer: BC Managed Care – PPO | Attending: Emergency Medicine | Admitting: Emergency Medicine

## 2018-04-16 DIAGNOSIS — N201 Calculus of ureter: Secondary | ICD-10-CM | POA: Insufficient documentation

## 2018-04-16 DIAGNOSIS — Z79899 Other long term (current) drug therapy: Secondary | ICD-10-CM | POA: Diagnosis not present

## 2018-04-16 DIAGNOSIS — R109 Unspecified abdominal pain: Secondary | ICD-10-CM | POA: Diagnosis present

## 2018-04-16 DIAGNOSIS — I10 Essential (primary) hypertension: Secondary | ICD-10-CM | POA: Insufficient documentation

## 2018-04-16 DIAGNOSIS — R3129 Other microscopic hematuria: Secondary | ICD-10-CM | POA: Diagnosis not present

## 2018-04-16 DIAGNOSIS — N23 Unspecified renal colic: Secondary | ICD-10-CM | POA: Diagnosis not present

## 2018-04-16 LAB — COMPREHENSIVE METABOLIC PANEL
ALK PHOS: 57 U/L (ref 38–126)
ALT: 45 U/L — AB (ref 0–44)
AST: 40 U/L (ref 15–41)
Albumin: 5 g/dL (ref 3.5–5.0)
Anion gap: 12 (ref 5–15)
BUN: 24 mg/dL — AB (ref 6–20)
CALCIUM: 9.7 mg/dL (ref 8.9–10.3)
CO2: 27 mmol/L (ref 22–32)
CREATININE: 1.03 mg/dL (ref 0.61–1.24)
Chloride: 101 mmol/L (ref 98–111)
Glucose, Bld: 174 mg/dL — ABNORMAL HIGH (ref 70–99)
Potassium: 3.8 mmol/L (ref 3.5–5.1)
Sodium: 140 mmol/L (ref 135–145)
Total Bilirubin: 1.5 mg/dL — ABNORMAL HIGH (ref 0.3–1.2)
Total Protein: 8.2 g/dL — ABNORMAL HIGH (ref 6.5–8.1)

## 2018-04-16 LAB — CBC WITH DIFFERENTIAL/PLATELET
Abs Immature Granulocytes: 0.07 10*3/uL (ref 0.00–0.07)
Basophils Absolute: 0.1 10*3/uL (ref 0.0–0.1)
Basophils Relative: 0 %
EOS PCT: 0 %
Eosinophils Absolute: 0 10*3/uL (ref 0.0–0.5)
HCT: 49 % (ref 39.0–52.0)
HEMOGLOBIN: 16.9 g/dL (ref 13.0–17.0)
Immature Granulocytes: 1 %
LYMPHS PCT: 10 %
Lymphs Abs: 1.2 10*3/uL (ref 0.7–4.0)
MCH: 29.2 pg (ref 26.0–34.0)
MCHC: 34.5 g/dL (ref 30.0–36.0)
MCV: 84.8 fL (ref 80.0–100.0)
MONOS PCT: 4 %
Monocytes Absolute: 0.5 10*3/uL (ref 0.1–1.0)
Neutro Abs: 10.2 10*3/uL — ABNORMAL HIGH (ref 1.7–7.7)
Neutrophils Relative %: 85 %
Platelets: 190 10*3/uL (ref 150–400)
RBC: 5.78 MIL/uL (ref 4.22–5.81)
RDW: 12.8 % (ref 11.5–15.5)
WBC: 12 10*3/uL — AB (ref 4.0–10.5)
nRBC: 0 % (ref 0.0–0.2)

## 2018-04-16 LAB — URINALYSIS, COMPLETE (UACMP) WITH MICROSCOPIC
BACTERIA UA: NONE SEEN
Bilirubin Urine: NEGATIVE
GLUCOSE, UA: NEGATIVE mg/dL
KETONES UR: NEGATIVE mg/dL
Leukocytes, UA: NEGATIVE
Nitrite: NEGATIVE
PROTEIN: 100 mg/dL — AB
SQUAMOUS EPITHELIAL / LPF: NONE SEEN (ref 0–5)
Specific Gravity, Urine: 1.024 (ref 1.005–1.030)
pH: 5 (ref 5.0–8.0)

## 2018-04-16 LAB — LIPASE, BLOOD: LIPASE: 30 U/L (ref 11–51)

## 2018-04-16 MED ORDER — SODIUM CHLORIDE 0.9 % IV BOLUS
1000.0000 mL | Freq: Once | INTRAVENOUS | Status: AC
  Administered 2018-04-16: 1000 mL via INTRAVENOUS

## 2018-04-16 MED ORDER — ONDANSETRON 4 MG PO TBDP: 4.0000 mg | ORAL_TABLET | Freq: Three times a day (TID) | ORAL | 0 refills | Status: DC | PRN

## 2018-04-16 MED ORDER — KETOROLAC TROMETHAMINE 30 MG/ML IJ SOLN
15.0000 mg | INTRAMUSCULAR | Status: AC
  Administered 2018-04-16: 15 mg via INTRAVENOUS
  Filled 2018-04-16: qty 1

## 2018-04-16 MED ORDER — TAMSULOSIN HCL 0.4 MG PO CAPS: 0.4000 mg | ORAL_CAPSULE | Freq: Every day | ORAL | 0 refills | Status: DC

## 2018-04-16 MED ORDER — ONDANSETRON HCL 4 MG/2ML IJ SOLN
4.0000 mg | Freq: Once | INTRAMUSCULAR | Status: AC
  Administered 2018-04-16: 4 mg via INTRAVENOUS
  Filled 2018-04-16: qty 2

## 2018-04-16 MED ORDER — FENTANYL CITRATE (PF) 100 MCG/2ML IJ SOLN
50.0000 ug | Freq: Once | INTRAMUSCULAR | Status: AC
  Administered 2018-04-16: 50 ug via INTRAVENOUS
  Filled 2018-04-16: qty 2

## 2018-04-16 MED ORDER — KETOROLAC TROMETHAMINE 10 MG PO TABS: 10.0000 mg | ORAL_TABLET | Freq: Four times a day (QID) | ORAL | 0 refills | Status: DC | PRN

## 2018-04-16 NOTE — ED Triage Notes (Signed)
Pt states he was woken this morning from abdominal pain. States it was sudden, severe, and sharp. Pt ambulatory; brought in to ED by family.

## 2018-04-16 NOTE — ED Provider Notes (Signed)
[facility] Emergency Department Provider Note  ____________________________________________  Time seen: Approximately 11:56 AM  I have reviewed the triage vital signs and the nursing notes.   HISTORY  Chief Complaint Abdominal Pain    HPI Wesley Fields is a 57 y.o. male with a history of GERD and hypertension who complains of left flank pain radiating to left lower quadrant that started suddenly at about 6:00 AM today.  It is constant, waxing and waning, no aggravating or alleviating factors.  Unable to find a position of comfort.  Severe, 10/10 and sharp.      Past Medical History:  Diagnosis Date  . GERD (gastroesophageal reflux disease)   . Hypertension      Patient Active Problem List   Diagnosis Date Noted  . Hyperglycemia, unspecified 02/13/2016  . Chronic foot pain, right 11/19/2015  . Anaphylaxis 01/19/2015  . Cirrhosis ([place]) 01/19/2015  . Hyperlipidemia, unspecified 01/19/2015  . Essential hypertension 01/19/2015  . Neuroendocrine tumor 01/19/2015     Past Surgical History:  Procedure Laterality Date  . CLUB FOOT RELEASE    . GALLBLADDER SURGERY       Prior to Admission medications   Medication Sig Start Date End Date Taking? Authorizing Provider  hydrochlorothiazide (HYDRODIURIL) 25 MG tablet Take 25 mg by mouth daily.  11/28/14  Yes [provider]  lisinopril (PRINIVIL,ZESTRIL) 10 MG tablet Take 10 mg by mouth daily.  04/04/14  Yes [provider]  pantoprazole (PROTONIX) 40 MG tablet Take 40 mg by mouth daily.  12/25/14  Yes [provider]  ketorolac (TORADOL) 10 MG tablet Take 1 tablet (10 mg total) by mouth every 6 (six) hours as needed for moderate pain. 04/16/18   Carrie Mew, MD  ondansetron (ZOFRAN ODT) 4 MG disintegrating tablet Take 1 tablet (4 mg total) by mouth every 8 (eight) hours as needed for nausea or vomiting. 04/16/18   Carrie Mew, MD  tamsulosin (FLOMAX) 0.4 MG CAPS  capsule Take 1 capsule (0.4 mg total) by mouth daily. 04/16/18   Carrie Mew, MD     Allergies Prednisone and Tetracycline   Family History  Problem Relation Age of Onset  . Prostate cancer Neg Hx   . Bladder Cancer Neg Hx   . Kidney cancer Neg Hx     Social History Social History   Tobacco Use  . Smoking status: Never Smoker  . Smokeless tobacco: Never Used  Substance Use Topics  . Alcohol use: No  . Drug use: No    Review of Systems  Constitutional:   No fever or chills.  ENT:   No sore throat. No rhinorrhea. Cardiovascular:   No chest pain or syncope. Respiratory:   No dyspnea or cough. Gastrointestinal:   Left flank pain as above without vomiting and diarrhea.  Musculoskeletal:   Negative for focal pain or swelling All other systems reviewed and are negative except as documented above in ROS and HPI.  ____________________________________________   PHYSICAL EXAM:  VITAL SIGNS: ED Triage Vitals  Enc Vitals Group     BP 04/16/18 0928 (!) 153/74     Pulse Rate 04/16/18 0928 (!) 57     Resp 04/16/18 0928 16     Temp 04/16/18 0928 (!) 97.5 F (36.4 C)     Temp Source 04/16/18 0928 Oral     SpO2 04/16/18 0928 99 %     Weight 04/16/18 0930 241 lb 2.9 oz (109.4 kg)     Height 04/16/18 0930 6' (1.829  m)     Head Circumference --      Peak Flow --      Pain Score 04/16/18 0930 10     Pain Loc --      Pain Edu? --      Excl. in [place]? --     Vital signs reviewed, nursing assessments reviewed.   Constitutional:   Alert and oriented.  Very uncomfortable but non-toxic appearance. Eyes:   Conjunctivae are normal. EOMI. PERRL. ENT      Head:   Normocephalic and atraumatic.      Nose:   No congestion/rhinnorhea.       Mouth/Throat:   MMM, no pharyngeal erythema. No peritonsillar mass.       Neck:   No meningismus. Full ROM. Hematological/Lymphatic/Immunilogical:   No cervical lymphadenopathy. Cardiovascular:   RRR. Symmetric bilateral radial and DP pulses.   No murmurs. Cap refill less than 2 seconds. Respiratory:   Normal respiratory effort without tachypnea/retractions. Breath sounds are clear and equal bilaterally. No wheezes/rales/rhonchi. Gastrointestinal:   Soft with left lower quadrant tenderness. Non distended. There is no CVA tenderness.  No rebound, rigidity, or guarding.  No hernia  Musculoskeletal:   Normal range of motion in all extremities. No joint effusions.  No lower extremity tenderness.  No edema. Neurologic:   Normal speech and language.  Motor grossly intact. No acute focal neurologic deficits are appreciated.  Skin:    Skin is warm, dry and intact. No rash noted.  No petechiae, purpura, or bullae.  ____________________________________________    LABS (pertinent positives/negatives) (all labs ordered are listed, but only abnormal results are displayed) Labs Reviewed  COMPREHENSIVE METABOLIC PANEL - Abnormal; Notable for the following components:      Result Value   Glucose, Bld 174 (*)    BUN 24 (*)    Total Protein 8.2 (*)    ALT 45 (*)    Total Bilirubin 1.5 (*)    All other components within normal limits  CBC WITH DIFFERENTIAL/PLATELET - Abnormal; Notable for the following components:   WBC 12.0 (*)    Neutro Abs 10.2 (*)    All other components within normal limits  URINALYSIS, COMPLETE (UACMP) WITH MICROSCOPIC - Abnormal; Notable for the following components:   Color, Urine AMBER (*)    APPearance CLOUDY (*)    Hgb urine dipstick LARGE (*)    Protein, ur 100 (*)    RBC / HPF >50 (*)    All other components within normal limits  URINE CULTURE  LIPASE, BLOOD   ____________________________________________   EKG    ____________________________________________    RADIOLOGY  Ct Renal Stone Study  Result Date: 04/16/2018 CLINICAL DATA:  57 year old male with a history of acute left lower quadrant pain EXAM: CT ABDOMEN AND PELVIS WITHOUT CONTRAST TECHNIQUE: Multidetector CT imaging of the abdomen  and pelvis was performed following the standard protocol without IV contrast. COMPARISON:  12/07/2013 FINDINGS: Lower chest: No acute abnormality. Hepatobiliary: Diffusely decreased attenuation of liver parenchyma. Cholecystectomy. Pancreas: Unremarkable pancreas Spleen: Unremarkable spleen Adrenals/Urinary Tract: Bilateral adrenal glands unremarkable. Right kidney without hydronephrosis. No nephrolithiasis. Unremarkable course of the right ureter. Left kidney demonstrates mild hydronephrosis with 1 mm-2 mm stones in the lower pole collecting system. There is mild perinephric stranding. Course of the left ureter is dilated with a distal ureteral stone just above the ureterovesical junction measuring 4 mm. Unremarkable urinary bladder. Stomach/Bowel: Unremarkable stomach. Unremarkable small bowel. Normal appendix. Mild stool burden. Colonic diverticular change without evidence  of a acute inflammatory changes. Vascular/Lymphatic: Calcifications of the vasculature. No aneurysm. No periaortic fluid. No lymphadenopathy. Reproductive: Calcifications of the prostate. Other: None Musculoskeletal: No acute displaced fracture. Degenerative changes of the visualized thoracolumbar spine. IMPRESSION: Distal left ureteral stone at the ureterovesical junction measuring 4 mm with mild left hydronephrosis. If there is concern for ascending urinary tract infection, recommend correlation with urinalysis. Additional small nonobstructive stones in the lower pole collecting system of the left kidney. Liver steatosis. Cholecystectomy. Electronically Signed   By: Corrie Mckusick D.O.   On: 04/16/2018 10:05    ____________________________________________   PROCEDURES Procedures  ____________________________________________  DIFFERENTIAL DIAGNOSIS   Kidney stone, AAA, cystitis/pyelonephritis, diverticulitis  CLINICAL IMPRESSION / ASSESSMENT AND PLAN / ED COURSE  Pertinent labs & imaging results that were available during my  care of the patient were reviewed by me and considered in my medical decision making (see chart for details).      Clinical Course as of Apr 17 1155  Fri Apr 16, 2018  0935 Severe left lower quadrant pain with guarding.  Doubt AAA or dissection, most likely kidney stone.  No hernias on exam.  Exam is somewhat concerning, so I will obtain a noncontrast CT study immediately, get labs, control pain with Toradol 50 mg IV and fentanyl 50 mg IV.  May need to consider contrast-enhanced study later depending on results of work-up and response to pain control.   [PS]  1040 CT scan shows a left distal ureter stone, 4 mm without hydronephrosis.  We will follow-up urinalysis, plan to discharge with symptom control.  CT Renal Stone Study [PS]  6283 UA not concerning for infection.  Control symptoms and follow-up with primary care.   [PS]    Clinical Course User Index [PS] Carrie Mew, MD     ____________________________________________   FINAL CLINICAL IMPRESSION(S) / ED DIAGNOSES    Final diagnoses:  Ureterolithiasis  Renal colic  Microscopic hematuria     ED Discharge Orders         Ordered    ketorolac (TORADOL) 10 MG tablet  Every 6 hours PRN     04/16/18 1156    ondansetron (ZOFRAN ODT) 4 MG disintegrating tablet  Every 8 hours PRN     04/16/18 1156    tamsulosin (FLOMAX) 0.4 MG CAPS capsule  Daily     04/16/18 1156          Portions of this note were generated with dragon dictation software. Dictation errors may occur despite best attempts at proofreading.    Carrie Mew, MD 04/16/18 [phone]

## 2018-04-16 NOTE — ED Notes (Signed)
Pt was given strainer as well as cup to place kidney stone in

## 2018-04-16 NOTE — ED Notes (Signed)
First Nurse Note: Patient taken to room 13 without triage.  Given gown to put on.  Bill RN aware of room placment and need for triage.

## 2018-04-16 NOTE — ED Notes (Signed)
First Nurse Note: Patient in acute distress, called Charge Nurse for immediate bedding.

## 2018-04-16 NOTE — Discharge Instructions (Signed)
Follow-up with your doctor for repeat urine test in 1 week to ensure the blood in your urine has resolved after passing the kidney stone.

## 2018-04-17 LAB — URINE CULTURE: Culture: NO GROWTH

## 2018-05-24 ENCOUNTER — Other Ambulatory Visit: Payer: BC Managed Care – PPO

## 2018-05-24 ENCOUNTER — Other Ambulatory Visit: Payer: Self-pay

## 2018-05-24 DIAGNOSIS — R972 Elevated prostate specific antigen [PSA]: Secondary | ICD-10-CM

## 2018-05-25 LAB — PSA: Prostate Specific Ag, Serum: 5.5 ng/mL — ABNORMAL HIGH (ref 0.0–4.0)

## 2018-06-01 ENCOUNTER — Encounter: Payer: Self-pay | Admitting: Urology

## 2018-06-01 ENCOUNTER — Ambulatory Visit: Payer: BC Managed Care – PPO | Admitting: Urology

## 2018-06-01 VITALS — BP 145/88 | HR 76 | Ht 72.0 in | Wt 248.0 lb

## 2018-06-01 DIAGNOSIS — Z87898 Personal history of other specified conditions: Secondary | ICD-10-CM | POA: Diagnosis not present

## 2018-06-01 NOTE — Progress Notes (Signed)
06/01/2018 5:00 PM   Lynetta Mare 10/07/1960 [id number]  Referring provider: Adin Hector, MD [place] [facility] [place], [place] [address]  Chief Complaint  Patient presents with  . Elevated PSA    HPI: 57 year old male with a history of elevated and fluctuating PSA returns to the office today for six-month follow-up.  His most recent PSA was 5.5.  He is due for rectal exam.  Since his last visit, he was seen in the ED on 03/2018 for left ureteral stone, 4 mm a the left UVJ.  His pain resolved the following day.    PSA trend is below: 5.5  11/25/219 5.0  11/09/2017  4.8 03/2017  --> PNBX 03/2017 negative for malignancy, inflammation, TRUS volume 46.8 cc. 6.52 03/05/2017 4.03 05/18/2016  3.27 08/09/2015  4.0 02/01/2015  3.11 01/18/2014   No family history of prostate cancer.  No baseline urinary symptoms.    PMHx significant for HTN, neuroendocrine tumor of the duodenal bulb.    PMH: Past Medical History:  Diagnosis Date  . GERD (gastroesophageal reflux disease)   . Hypertension     Surgical History: Past Surgical History:  Procedure Laterality Date  . CLUB FOOT RELEASE    . GALLBLADDER SURGERY      Home Medications:  Allergies as of 06/01/2018      Reactions   Prednisone Other (See Comments)   Tetracycline Other (See Comments)      Medication List        Accurate as of 06/01/18  5:00 PM. Always use your most recent med list.          hydrochlorothiazide 25 MG tablet Commonly known as:  HYDRODIURIL Take 25 mg by mouth daily.   lisinopril 10 MG tablet Commonly known as:  PRINIVIL,ZESTRIL Take 10 mg by mouth daily.   pantoprazole 40 MG tablet Commonly known as:  PROTONIX Take 40 mg by mouth daily.       Allergies:  Allergies  Allergen Reactions  . Prednisone Other (See Comments)  . Tetracycline Other (See Comments)    Family History: Family History  Problem Relation Age of Onset  . Prostate cancer Neg Hx   .  Bladder Cancer Neg Hx   . Kidney cancer Neg Hx     Social History:  reports that he has never smoked. He has never used smokeless tobacco. He reports that he does not drink alcohol or use drugs.  ROS: UROLOGY Frequent Urination?: No Hard to postpone urination?: No Burning/pain with urination?: No Get up at night to urinate?: No Leakage of urine?: No Urine stream starts and stops?: No Trouble starting stream?: No Do you have to strain to urinate?: No Blood in urine?: No Urinary tract infection?: No Sexually transmitted disease?: No Injury to kidneys or bladder?: No Painful intercourse?: No Weak stream?: No Erection problems?: No Penile pain?: No  Gastrointestinal Nausea?: No Vomiting?: No Indigestion/heartburn?: No Diarrhea?: No Constipation?: No  Constitutional Fever: No Night sweats?: No Weight loss?: No Fatigue?: No  Skin Skin rash/lesions?: No Itching?: No  Eyes Blurred vision?: No Double vision?: No  Ears/Nose/Throat Sore throat?: No Sinus problems?: No  Hematologic/Lymphatic Swollen glands?: No Easy bruising?: No  Cardiovascular Leg swelling?: No Chest pain?: No  Respiratory Cough?: No Shortness of breath?: No  Endocrine Excessive thirst?: No  Musculoskeletal Back pain?: No Joint pain?: No  Neurological Headaches?: No Dizziness?: No  Psychologic Depression?: No Anxiety?: No  Physical Exam: BP (!) 145/88   Pulse  76   Ht 6' (1.829 m)   Wt 248 lb (112.5 kg)   BMI 33.63 kg/m   Constitutional:  Alert and oriented, No acute distress. HEENT: [place] AT, moist mucus membranes.  Trachea midline, no masses. Cardiovascular: No clubbing, cyanosis, or edema. Respiratory: Normal respiratory effort, no increased work of breathing. GI: Abdomen is soft, nontender, nondistended, no abdominal masses GU: No CVA tenderness Rectal: Normal sphincter tone.  40 cc prostate, nontender, no nodules. Skin: No rashes, bruises or suspicious  lesions. Neurologic: Grossly intact, no focal deficits, moving all 4 extremities. Psychiatric: Normal mood and affect.  Laboratory Data: Lab Results  Component Value Date   WBC 12.0 (H) 04/16/2018   HGB 16.9 04/16/2018   HCT 49.0 04/16/2018   MCV 84.8 04/16/2018   PLT 190 04/16/2018    Lab Results  Component Value Date   CREATININE 1.03 04/16/2018   PSA trend as above  Assessment & Plan:    1. Elevated PSA Personal history of elevated/fluctuating PSA Most recent PSA is up slightly to 5.5 from 5.0 6 months ago, within the range of previous fluctuation This point time, I would recommend continued surveillance especially in the setting of recent negative biopsy with prostatic inflammation present If his PSA rises again in 6 months, will order prostate MRI Patient is agreeable this plan  - PSA; Future   Return in about 6 months (around 12/01/2018) for PSA (prior).  Hollice Espy, MD  [facility] [address] [place], [place] [address] [phone]

## 2018-08-04 IMAGING — CR DX Chest PA Lateral
2 series · 2 of 2 positions shown · non-contrast
Comparison: Chest x-ray 09/14/2015                                                                     
 TECHNICAL:                                                                                
 PA and lateral images.

CHEST X-RAY 2-VIEWS
CLINICAL INFORMATION: Chest pain.

[PA]
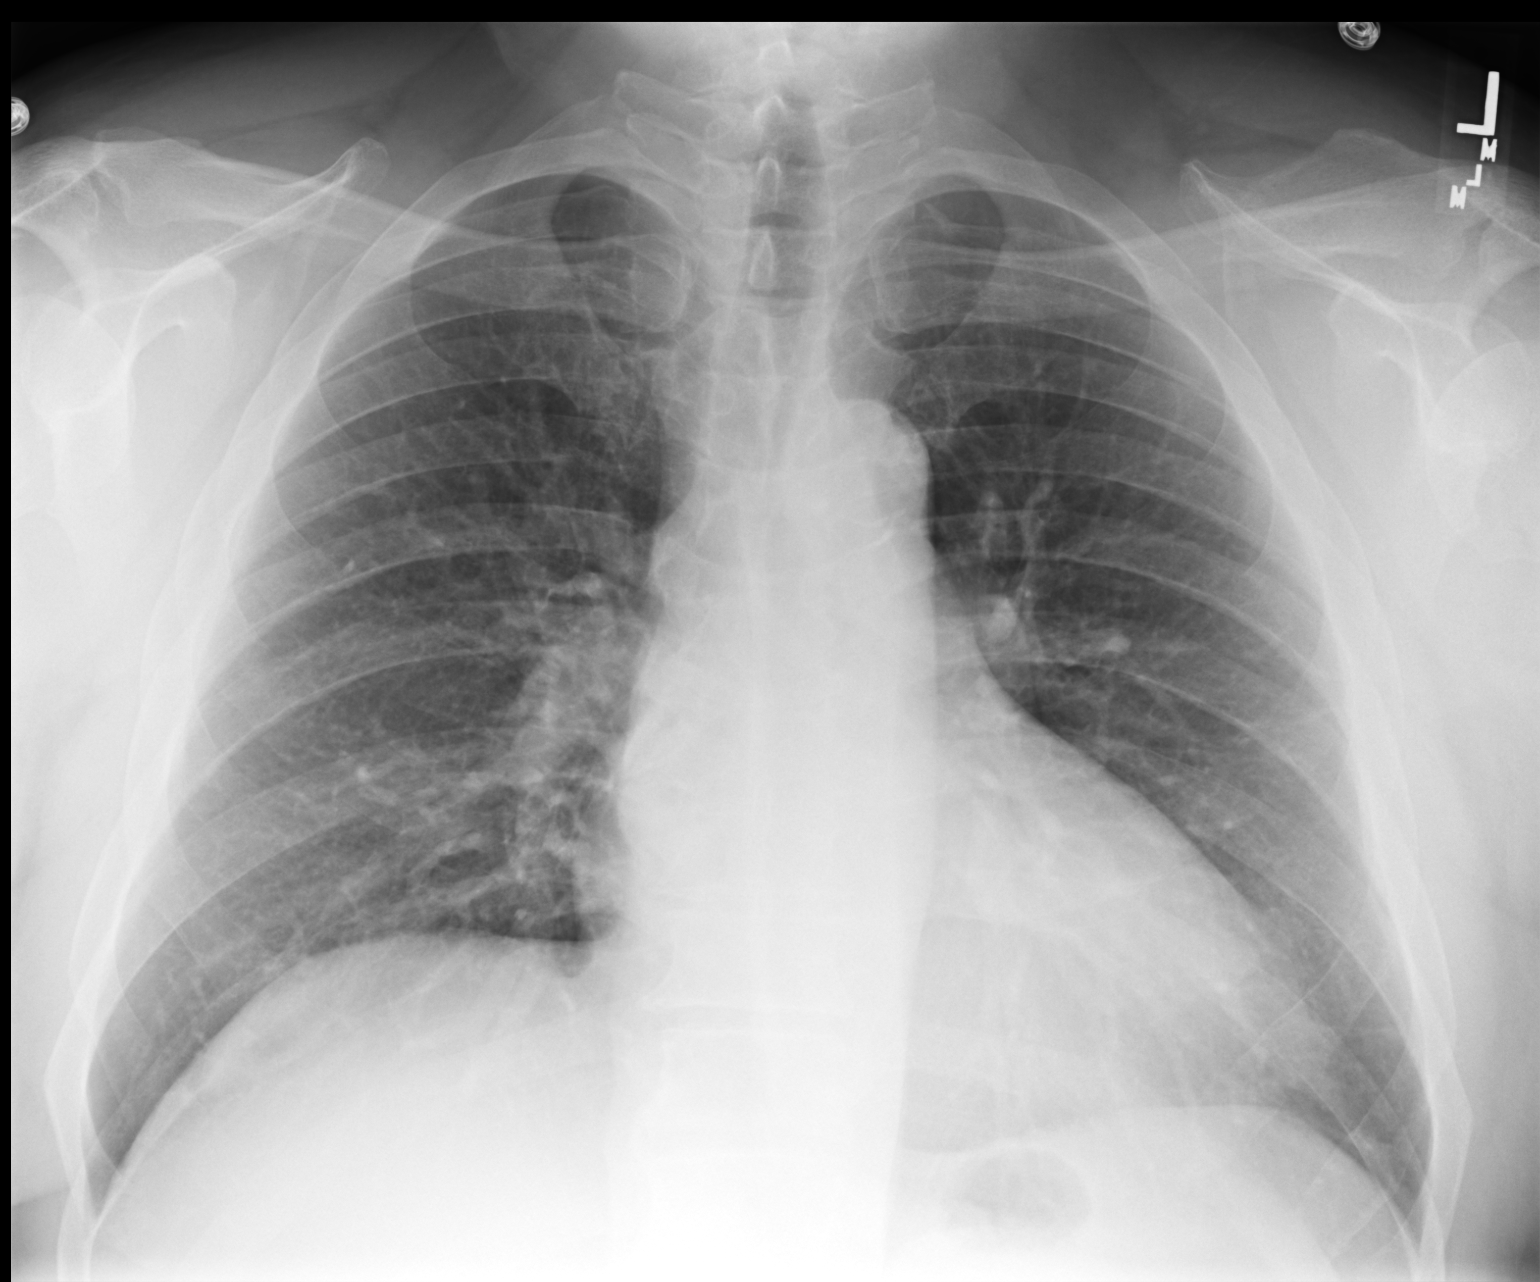

[lateral]
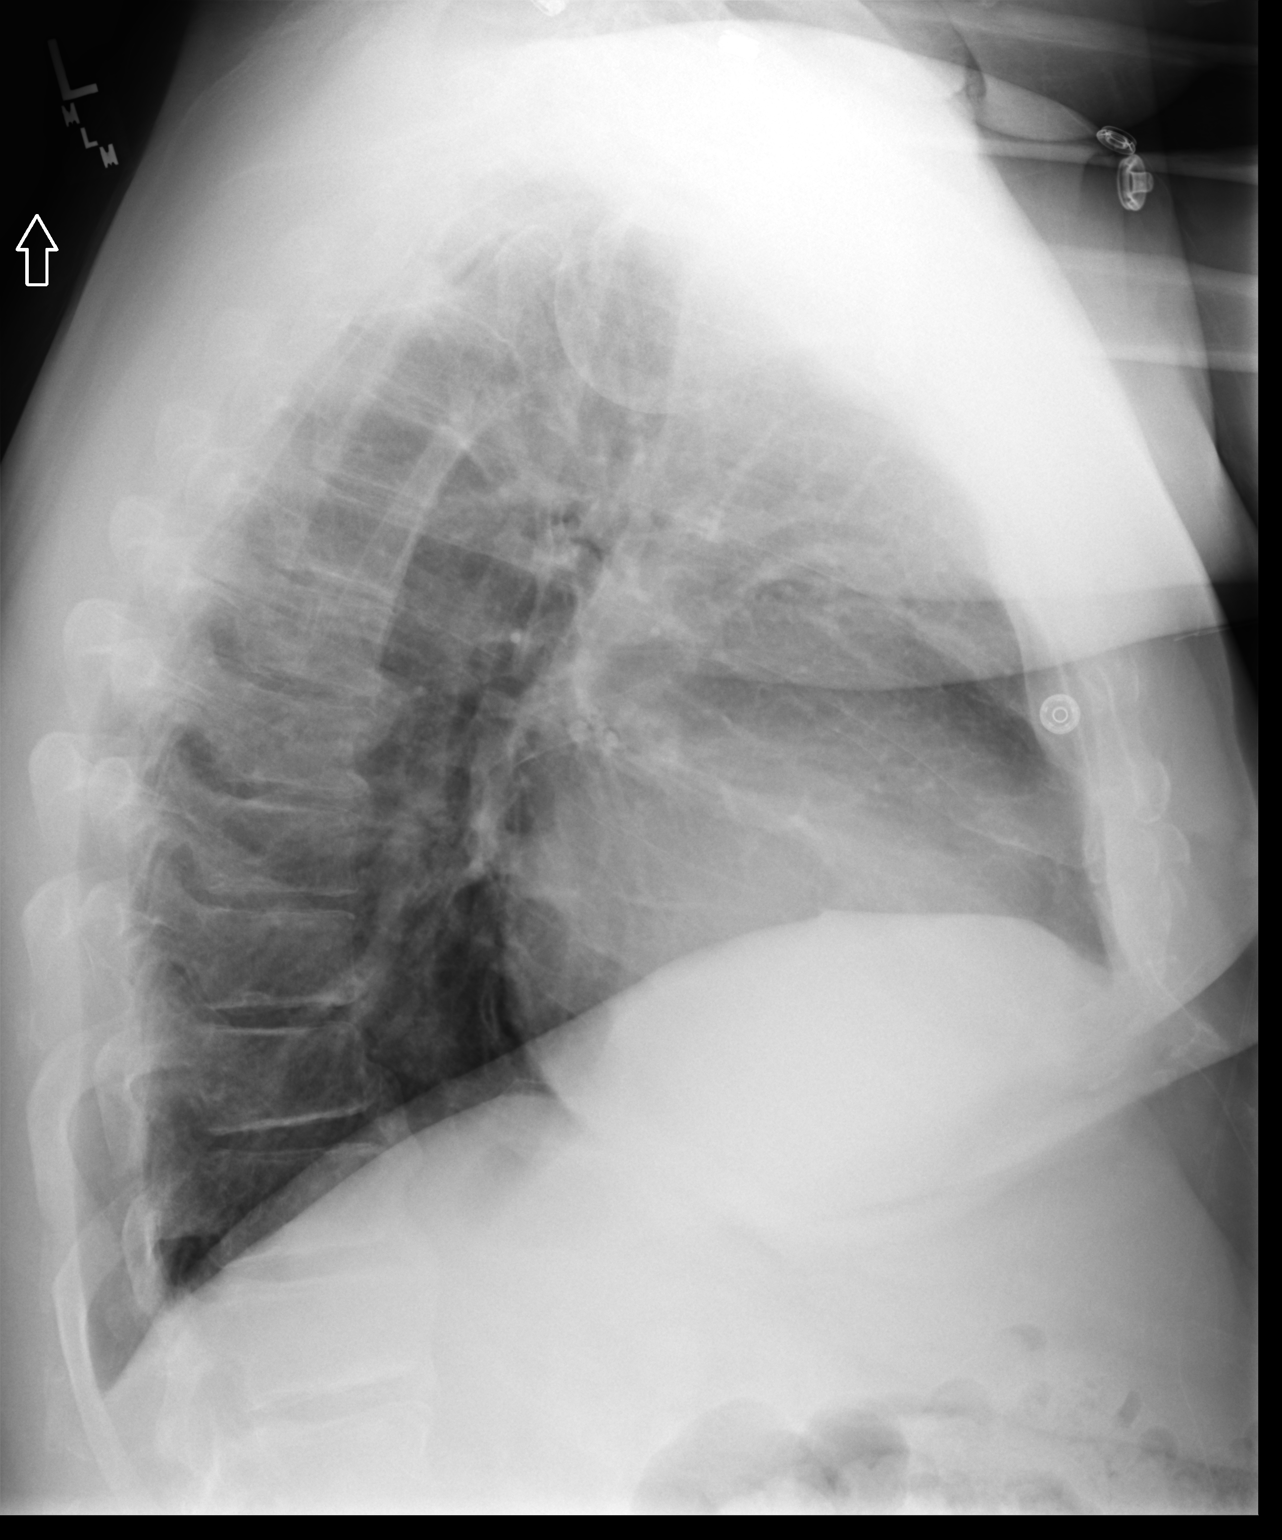

[2 of 2 positions shown; findings below may reference images not displayed]

FINDINGS: No mediastinal widening or shift. Calcified left hilar lymph nodes. Mild                  
 cardiomegaly with normal pulmonary vascularity. No pulmonary abnormality. No              
 pleural space abnormality. No bone abnormality.
IMPRESSION: 1.  No acute finding.                                                                     
 2.  Chronic mild cardiomegaly.

## 2018-12-02 ENCOUNTER — Other Ambulatory Visit: Payer: BC Managed Care – PPO

## 2018-12-02 ENCOUNTER — Other Ambulatory Visit: Payer: Self-pay

## 2018-12-02 DIAGNOSIS — Z87898 Personal history of other specified conditions: Secondary | ICD-10-CM

## 2018-12-03 LAB — PSA: Prostate Specific Ag, Serum: 5.7 ng/mL — ABNORMAL HIGH (ref 0.0–4.0)

## 2018-12-07 ENCOUNTER — Ambulatory Visit: Payer: BC Managed Care – PPO | Admitting: Urology

## 2018-12-09 ENCOUNTER — Other Ambulatory Visit: Payer: Self-pay

## 2018-12-09 ENCOUNTER — Telehealth (INDEPENDENT_AMBULATORY_CARE_PROVIDER_SITE_OTHER): Payer: BC Managed Care – PPO | Admitting: Urology

## 2018-12-09 DIAGNOSIS — N411 Chronic prostatitis: Secondary | ICD-10-CM | POA: Diagnosis not present

## 2018-12-09 DIAGNOSIS — R972 Elevated prostate specific antigen [PSA]: Secondary | ICD-10-CM | POA: Diagnosis not present

## 2018-12-09 DIAGNOSIS — N200 Calculus of kidney: Secondary | ICD-10-CM

## 2018-12-09 NOTE — Progress Notes (Signed)
Virtual Visit via Video Note  I connected with Wesley Fields on 12/09/18 at 10:00 AM EDT by a video enabled telemedicine application and verified that I am speaking with the correct person using two identifiers.  Location: Patient: home Provider: office   I discussed the limitations of evaluation and management by telemedicine and the availability of in person appointments. The patient expressed understanding and agreed to proceed.  History of Present Illness: 58 year-old male who returns today for follow-up of history of elevated PSA.  Since his last visit, he is also passed an interval stone in 03/2017 requiring a trip to the emergency room.  PSA trend is below: 5.7 12/02/2018   5.5  05/24/2018  5.0  11/09/2017  4.8 03/2017  --> PNBX 03/2017 negative for malignancy, inflammation, TRUS volume 46.8 cc. 6.52 03/05/2017 4.03 05/18/2016  3.27 08/09/2015  4.0 02/01/2015  3.11 01/18/2014   He does have a personal history of intermittent episodes of prostatitis.  He has approximately 3 flares per year.  His most recent flare was about 2 months ago.  Currently, he is asymptomatic.  He has no voiding complaints today.  He also notes today that he was seen and evaluated in the emergency room on 03/2018 with left flank pain found to have a 4 mm left UVJ stone.  He subsequently passed the stone.  He had bilateral nonobstructing stones on CT scan which were punctate in size.  No additional significant stone burden.  This was his first and only stone episode.  He denies ongoing or recurrent flank pain.  He does report that he drinks lots of water.  He always tries to add lemon to his water as well.  He tries to evaluate salt.  Observations/Objective: Pleasant, interactive  Assessment and Plan:  1. Elevated PSA 3 of elevated/rising PSA  PSA now up to 5.7 with a steady rise which somewhat concerning.  Previous biopsy negative.  We discussed the differential diagnosis again today at length.  This  point time, I did recommend further evaluation with prostate MRI given his relatively young age and good health.  We discussed that if a lesion is identified, we could consider pursuing fusion biopsy with a targeted approach.  He is extremely hesitant to have another prostate biopsy as he found this extremely uncomfortable.    2. Chronic prostatitis Intermittent episodes of prostatitis, recommend supportive care during these episodes Currently asymptomatic  3. Kidney stones Isolated episode of spontaneous passage of small stone CT scan images  reviewed-no additional significant stone burden We discussed general stone prevention techniques including drinking plenty water with goal of producing 2.5 L urine daily, increased citric acid intake, avoidance of high oxalate containing foods, and decreased salt intake.   Follow Up Instructions: MRI ordered- will call with results and recommendations   I discussed the assessment and treatment plan with the patient. The patient was provided an opportunity to ask questions and all were answered. The patient agreed with the plan and demonstrated an understanding of the instructions.   The patient was advised to call back or seek an in-person evaluation if the symptoms worsen or if the condition fails to improve as anticipated.  I provided 16 minutes of non-face-to-face time during this encounter.   Hollice Espy, MD

## 2019-01-14 ENCOUNTER — Ambulatory Visit
Admission: RE | Admit: 2019-01-14 | Discharge: 2019-01-14 | Disposition: A | Discharge status: Home / Self Care | Payer: BC Managed Care – PPO | Source: Ambulatory Visit | Attending: Urology | Admitting: Urology

## 2019-01-14 ENCOUNTER — Other Ambulatory Visit: Payer: Self-pay

## 2019-01-14 DIAGNOSIS — R972 Elevated prostate specific antigen [PSA]: Secondary | ICD-10-CM | POA: Diagnosis present

## 2019-01-14 MED ORDER — GADOBUTROL 1 MMOL/ML IV SOLN
9.0000 mL | Freq: Once | INTRAVENOUS | Status: AC | PRN
  Administered 2019-01-14: 9 mL via INTRAVENOUS

## 2019-01-21 ENCOUNTER — Telehealth: Payer: Self-pay | Admitting: Urology

## 2019-01-21 DIAGNOSIS — R972 Elevated prostate specific antigen [PSA]: Secondary | ICD-10-CM

## 2019-01-21 NOTE — Telephone Encounter (Signed)
Appointment made

## 2019-01-21 NOTE — Telephone Encounter (Signed)
Call patient to discuss MRI findings.  No high-grade prostatic lesions identified which is reassuring he does have an incidental prostatic cyst which is favored to be benign possibly related to prostatitis.  In addition to the above, incidental rectal thickening was appreciated.  Patient reports today that he is due for colonoscopy and was waiting to get this scheduled until he is address his prostate issues.  He will call and schedule this today.  All questions answered.  I like to see him in 6 months with PSA prior.  He is agreeable this plan.  Hollice Espy, MD

## 2019-06-30 ENCOUNTER — Other Ambulatory Visit: Payer: Self-pay | Admitting: Internal Medicine

## 2019-06-30 DIAGNOSIS — Z0000 Encounter for general adult medical examination without abnormal findings: Secondary | ICD-10-CM

## 2019-06-30 DIAGNOSIS — K7460 Unspecified cirrhosis of liver: Secondary | ICD-10-CM

## 2019-07-25 ENCOUNTER — Other Ambulatory Visit: Payer: BC Managed Care – PPO

## 2019-07-25 ENCOUNTER — Ambulatory Visit
Admission: RE | Admit: 2019-07-25 | Discharge: 2019-07-25 | Disposition: A | Discharge status: Home / Self Care | Payer: BC Managed Care – PPO | Source: Ambulatory Visit | Attending: Internal Medicine | Admitting: Internal Medicine

## 2019-07-25 ENCOUNTER — Other Ambulatory Visit: Payer: Self-pay

## 2019-07-25 DIAGNOSIS — K7460 Unspecified cirrhosis of liver: Secondary | ICD-10-CM | POA: Diagnosis not present

## 2019-07-25 DIAGNOSIS — Z0000 Encounter for general adult medical examination without abnormal findings: Secondary | ICD-10-CM | POA: Diagnosis present

## 2019-07-25 DIAGNOSIS — R972 Elevated prostate specific antigen [PSA]: Secondary | ICD-10-CM

## 2019-07-26 LAB — PSA: Prostate Specific Ag, Serum: 7.3 ng/mL — ABNORMAL HIGH (ref 0.0–4.0)

## 2019-07-28 ENCOUNTER — Ambulatory Visit (INDEPENDENT_AMBULATORY_CARE_PROVIDER_SITE_OTHER): Payer: BC Managed Care – PPO | Admitting: Urology

## 2019-07-28 ENCOUNTER — Encounter: Payer: Self-pay | Admitting: Urology

## 2019-07-28 ENCOUNTER — Other Ambulatory Visit: Payer: Self-pay

## 2019-07-28 VITALS — BP 168/88 | HR 96 | Ht 72.0 in | Wt 241.0 lb

## 2019-07-28 DIAGNOSIS — N200 Calculus of kidney: Secondary | ICD-10-CM | POA: Diagnosis not present

## 2019-07-28 DIAGNOSIS — N411 Chronic prostatitis: Secondary | ICD-10-CM | POA: Diagnosis not present

## 2019-07-28 DIAGNOSIS — R972 Elevated prostate specific antigen [PSA]: Secondary | ICD-10-CM

## 2019-07-28 NOTE — Progress Notes (Signed)
07/28/2019 10:28 AM   Wesley Fields 11-Jun-1961 [id number]  Referring provider: Adin Hector, MD [place] [facility] [place],  [place] [address]  Chief Complaint  Patient presents with  . Follow-up    HPI: 59 year old male with a history of elevated PSA/chronic prostatitis who returns today for routine follow-up.  His PSA today is more elevated than previous, see below.  He does endorse having discomfort in his perineum which lasted about 2 weeks in late December.  Was not associated with any urinary symptoms.  This is consistent with his previous episodes of prostatitis.  It is significantly improved.  Baseline frequency but medications and behavior related.  Minimal bother.    No kidney stone episodes since last visit.    PSA trend is below: 7.3 07/25/19 MRI prostate 12/2018 PI-RADS I, 20 mm prostatic cyst vs. Prostatitis 5.7 12/02/18 5.5  11/25/219 5.0  11/09/2017  4.8 03/2017 -->PNBX 03/2017 negative for malignancy, inflammation, TRUS volume 46.8 cc. 6.52 03/05/2017 4.03 05/18/2016  3.27 08/09/2015  4.0 02/01/2015  3.11 01/18/2014   PMH: Past Medical History:  Diagnosis Date  . GERD (gastroesophageal reflux disease)   . Hypertension     Surgical History: Past Surgical History:  Procedure Laterality Date  . CLUB FOOT RELEASE    . GALLBLADDER SURGERY      Home Medications:  Allergies as of 07/28/2019      Reactions   Prednisone Other (See Comments)   Tetracycline Other (See Comments)      Medication List       Accurate as of July 28, 2019 10:28 AM. If you have any questions, ask your nurse or doctor.        hydrochlorothiazide 25 MG tablet Commonly known as: HYDRODIURIL Take 25 mg by mouth daily.   lisinopril 10 MG tablet Commonly known as: ZESTRIL Take 10 mg by mouth daily.   pantoprazole 40 MG tablet Commonly known as: PROTONIX Take 40 mg by mouth daily.       Allergies:  Allergies  Allergen Reactions  .  Prednisone Other (See Comments)  . Tetracycline Other (See Comments)    Family History: Family History  Problem Relation Age of Onset  . Prostate cancer Neg Hx   . Bladder Cancer Neg Hx   . Kidney cancer Neg Hx     Social History:  reports that he has never smoked. He has never used smokeless tobacco. He reports that he does not drink alcohol or use drugs.  ROS: UROLOGY Frequent Urination?: No Hard to postpone urination?: No Burning/pain with urination?: No Get up at night to urinate?: No Leakage of urine?: No Urine stream starts and stops?: No Trouble starting stream?: No Do you have to strain to urinate?: No Blood in urine?: No Urinary tract infection?: No Sexually transmitted disease?: No Injury to kidneys or bladder?: No Painful intercourse?: No Weak stream?: No Erection problems?: No Penile pain?: No  Gastrointestinal Nausea?: No Vomiting?: No Indigestion/heartburn?: No Diarrhea?: No Constipation?: No  Constitutional Fever: No Night sweats?: No Weight loss?: No Fatigue?: No  Skin Skin rash/lesions?: No Itching?: No  Eyes Blurred vision?: No Double vision?: No  Ears/Nose/Throat Sore throat?: No Sinus problems?: No  Hematologic/Lymphatic Swollen glands?: No Easy bruising?: No  Cardiovascular Leg swelling?: No Chest pain?: No  Respiratory Cough?: No Shortness of breath?: No  Endocrine Excessive thirst?: No  Musculoskeletal Back pain?: No Joint pain?: No  Neurological Headaches?: No Dizziness?: No  Psychologic Depression?: No Anxiety?: No  Physical Exam: BP (!) 168/88   Pulse 96   Ht 6' (1.829 m)   Wt 241 lb (109.3 kg)   BMI 32.69 kg/m   Constitutional:  Alert and oriented, No acute distress. HEENT: [place] AT, moist mucus membranes.  Trachea midline, no masses. Cardiovascular: No clubbing, cyanosis, or edema. Respiratory: Normal respiratory effort, no increased work of breathing. Rectal: Normal sphincter tone.  External  hemorrhoids.  50 cc prostate, nontender, no nodules.  Diffusely firm. Skin: No rashes, bruises or suspicious lesions. Neurologic: Grossly intact, no focal deficits, moving all 4 extremities. Psychiatric: Normal mood and affect.  Laboratory Data: Lab Results  Component Value Date   WBC 12.0 (H) 04/16/2018   HGB 16.9 04/16/2018   HCT 49.0 04/16/2018   MCV 84.8 04/16/2018   PLT 190 04/16/2018    Lab Results  Component Value Date   CREATININE 1.03 04/16/2018   Assessment & Plan:    1. Chronic prostatitis Episode in December which may account for more precipitous rise in PSA as below  Clinically improving, no intervention - PSA; Future  2. Elevated PSA Personal history of elevated/fluctuating PSA  Has had previous negative biopsies as well as a reassuring MRI approximately 6 months ago.  In light of his recent episode of perineal pain consistent with his previous episodes of prostatitis, his PSA may be reflective of this.  Plan to have very close follow-up with a PSA lab only 3 months.  He is agreeable this plan. - PSA; Future  3. Kidney stones Personal history of kidney stones, drinking plenty of water  KUB in 6 months - Abdomen 1 view (KUB); Future   Return for 3 months PSA lab only, 6 months MD with PSA prior and KUB.  Hollice Espy, MD  [facility] [address] [place], [place] [address] [phone]

## 2019-10-25 ENCOUNTER — Other Ambulatory Visit: Payer: BC Managed Care – PPO

## 2019-10-25 ENCOUNTER — Other Ambulatory Visit: Payer: Self-pay

## 2019-10-25 DIAGNOSIS — N411 Chronic prostatitis: Secondary | ICD-10-CM

## 2019-10-25 DIAGNOSIS — R972 Elevated prostate specific antigen [PSA]: Secondary | ICD-10-CM

## 2019-10-26 LAB — PSA: Prostate Specific Ag, Serum: 7.2 ng/mL — ABNORMAL HIGH (ref 0.0–4.0)

## 2019-11-04 ENCOUNTER — Telehealth: Payer: Self-pay | Admitting: *Deleted

## 2019-11-04 NOTE — Telephone Encounter (Addendum)
Patient informed, scheduled lab appointment. Voiced understanding.   ----- Message from Hollice Espy, MD sent at 11/04/2019  9:13 AM EDT ----- PSA unchanged.  Will see you again in a few months as scheduled.    Hollice Espy, MD

## 2020-01-20 ENCOUNTER — Other Ambulatory Visit: Payer: Self-pay

## 2020-01-20 DIAGNOSIS — R972 Elevated prostate specific antigen [PSA]: Secondary | ICD-10-CM

## 2020-01-23 ENCOUNTER — Other Ambulatory Visit: Payer: BC Managed Care – PPO

## 2020-01-23 ENCOUNTER — Other Ambulatory Visit: Payer: Self-pay

## 2020-01-23 DIAGNOSIS — R972 Elevated prostate specific antigen [PSA]: Secondary | ICD-10-CM

## 2020-01-24 ENCOUNTER — Other Ambulatory Visit: Payer: Self-pay

## 2020-01-24 LAB — PSA: Prostate Specific Ag, Serum: 8.2 ng/mL — ABNORMAL HIGH (ref 0.0–4.0)

## 2020-01-26 ENCOUNTER — Ambulatory Visit: Payer: Self-pay | Admitting: Urology

## 2020-01-30 NOTE — Progress Notes (Addendum)
01/31/2020 10:53 AM   Wesley Fields Oct 26, 1960 [id number]  Referring provider: Adin Hector, MD [facility] [place],  [place] [address] Chief Complaint  Patient presents with  . Follow-up    HPI: Wesley Fields is a 59 y.o. with a history of elevated PSA/chronic prostatitis/nephrolithiasis who returns today for routine follow-up.  KUB today shows no significant stone burden.  No stone episodes since last visit.g  PSA trend is below: 8.2  01/23/2020 7.2  10/25/19 7.3 07/25/19 MRI prostate 12/2018 PI-RADS I, 20 mm prostatic cyst vs. Prostatitis 5.7 12/02/18 5.5 11/25/219 5.0 11/09/2017  4.8 03/2017 -->PNBX 03/2017 negative for malignancy, inflammation, TRUS volume 46.8 cc. 6.52 03/05/2017 4.03 05/18/2016  3.27 08/09/2015  4.0 02/01/2015  3.11 01/18/2014   PMH: Past Medical History:  Diagnosis Date  . GERD (gastroesophageal reflux disease)   . Hypertension     Surgical History: Past Surgical History:  Procedure Laterality Date  . CLUB FOOT RELEASE    . GALLBLADDER SURGERY      Home Medications:  Allergies as of 01/31/2020      Reactions   Prednisone Other (See Comments)   Tetracycline Other (See Comments)      Medication List       Accurate as of January 31, 2020 10:53 AM. If you have any questions, ask your nurse or doctor.        hydrochlorothiazide 25 MG tablet Commonly known as: HYDRODIURIL Take 25 mg by mouth daily.   lisinopril 10 MG tablet Commonly known as: ZESTRIL Take 10 mg by mouth daily.   pantoprazole 40 MG tablet Commonly known as: PROTONIX Take 40 mg by mouth daily.       Allergies:  Allergies  Allergen Reactions  . Prednisone Other (See Comments)  . Tetracycline Other (See Comments)    Family History: Family History  Problem Relation Age of Onset  . Prostate cancer Neg Hx   . Bladder Cancer Neg Hx   . Kidney cancer Neg Hx     Social History:  reports that he has never smoked. He has never  used smokeless tobacco. He reports that he does not drink alcohol and does not use drugs.   Physical Exam: BP (!) 176/100   Pulse 66   Ht 6' (1.829 m)   Wt 241 lb (109.3 kg)   BMI 32.69 kg/m   Constitutional:  Alert and oriented, No acute distress. HEENT: [place] AT, moist mucus membranes.  Trachea midline, no masses. Cardiovascular: No clubbing, cyanosis, or edema. Respiratory: Normal respiratory effort, no increased work of breathing. Skin: No rashes, bruises or suspicious lesions. Neurologic: Grossly intact, no focal deficits, moving all 4 extremities. Psychiatric: Normal mood and affect.   Pertinent Imaging: KUB was personally reviewed today.  No significant upper tract stone burden.  Comparison to CT scan from 2019, he likely has a stone in the left lower pole which is not visualized today on KUB.  UA Negative  Assessment & Plan:    1. Elevated PSA Personal history of elevated/fluctuating PSA.  PSA is trending upwards which is concerning.    He has had extensive evaluation up to this point time including an MRI last year and a biopsy prior to that none of which showed any pathology.   At this point, given the continued upward trend in PSA velocity, I recommended consideration of repeat MRI versus biopsy.  Since he has a previous MRI, be helpful to compare these 2 studies.  He is most agreeable to this.  We will plan for MRI and follow-up thereafter.  3. Kidney stones  Personal history of kidney stones   KUB shows no significant stone burden.     [facility] [address] [place], [place] [address] [phone]  I, Selena Batten, am acting as a scribe for Dr. Hollice Espy.  I have reviewed the above documentation for accuracy and completeness, and I agree with the above.   Hollice Espy, MD

## 2020-01-31 ENCOUNTER — Other Ambulatory Visit: Payer: Self-pay

## 2020-01-31 ENCOUNTER — Encounter: Payer: Self-pay | Admitting: Urology

## 2020-01-31 ENCOUNTER — Ambulatory Visit
Admission: RE | Admit: 2020-01-31 | Discharge: 2020-01-31 | Disposition: A | Discharge status: Home / Self Care | Payer: BC Managed Care – PPO | Source: Ambulatory Visit | Attending: Urology | Admitting: Urology

## 2020-01-31 ENCOUNTER — Ambulatory Visit (INDEPENDENT_AMBULATORY_CARE_PROVIDER_SITE_OTHER): Payer: BC Managed Care – PPO | Admitting: Urology

## 2020-01-31 ENCOUNTER — Ambulatory Visit
Admission: RE | Admit: 2020-01-31 | Discharge: 2020-01-31 | Disposition: A | Discharge status: Home / Self Care | Payer: BC Managed Care – PPO | Attending: Urology | Admitting: Urology

## 2020-01-31 VITALS — BP 176/100 | HR 66 | Ht 72.0 in | Wt 241.0 lb

## 2020-01-31 DIAGNOSIS — R972 Elevated prostate specific antigen [PSA]: Secondary | ICD-10-CM

## 2020-01-31 DIAGNOSIS — N411 Chronic prostatitis: Secondary | ICD-10-CM

## 2020-01-31 DIAGNOSIS — N200 Calculus of kidney: Secondary | ICD-10-CM | POA: Insufficient documentation

## 2020-02-02 LAB — URINALYSIS, COMPLETE
Bilirubin, UA: NEGATIVE
Leukocytes,UA: NEGATIVE
Nitrite, UA: NEGATIVE
Protein,UA: NEGATIVE
RBC, UA: NEGATIVE
Specific Gravity, UA: 1.025 (ref 1.005–1.030)
Urobilinogen, Ur: 0.2 mg/dL (ref 0.2–1.0)
pH, UA: 7 (ref 5.0–7.5)

## 2020-02-02 LAB — MICROSCOPIC EXAMINATION
Bacteria, UA: NONE SEEN
Epithelial Cells (non renal): NONE SEEN /hpf (ref 0–10)

## 2020-03-12 ENCOUNTER — Other Ambulatory Visit: Payer: Self-pay

## 2020-03-12 ENCOUNTER — Ambulatory Visit
Admission: RE | Admit: 2020-03-12 | Discharge: 2020-03-12 | Disposition: A | Discharge status: Home / Self Care | Payer: BC Managed Care – PPO | Source: Ambulatory Visit | Attending: Urology | Admitting: Urology

## 2020-03-12 DIAGNOSIS — R972 Elevated prostate specific antigen [PSA]: Secondary | ICD-10-CM | POA: Diagnosis not present

## 2020-03-12 MED ORDER — GADOBUTROL 1 MMOL/ML IV SOLN
10.0000 mL | Freq: Once | INTRAVENOUS | Status: AC | PRN
  Administered 2020-03-12: 10 mL via INTRAVENOUS

## 2020-03-12 NOTE — Progress Notes (Signed)
03/13/2020 12:58 PM   Wesley Fields 02/14/1961 [id number]  Referring provider: Adin Hector, MD [facility] [place],  [place] [address] Chief Complaint  Patient presents with   Elevated PSA    HPI: Wesley Fields is a 59 y.o. male who returns for a 6 week follow up of elevated PSA and kidney stone.   Prostate MRI on 03/12/2020 revealed calculated prostate volume 47.3 mL. A14 mm irregular low T2 lesion along the right anterior aspect of the central gland near the apex, raising concern for high-grade macroscopic prostate cancer. PI-RADS 4.  No findings suspicious for high-grade macroscopic prostate cancer within the peripheral zone. Suspected post inflammatory scarring related to prior prostatitis. Tumor abuts the anterior fibromuscular stroma. No seminal vesicle invasion, lymphadenopathy, or metastatic disease.  The patient is under a lot of stress and anxiety related to his current health condition and work. He will feel much better once he gets the results from biopsy. He is anxious about biopsy given difficulty with previous biopsy. He is worried about post procedure symptoms such as hematuria.   PSA trend is below: 8.2  01/23/2020 7.2  10/25/19 7.3 07/25/19 MRI prostate 12/2018 PI-RADS I, 20 mm prostatic cyst vs. Prostatitis 5.7 12/02/18 5.5 11/25/219 5.0 11/09/2017  4.8 03/2017 -->PNBX 03/2017 negative for malignancy, inflammation, TRUS volume 46.8 cc. 6.52 03/05/2017 4.03 05/18/2016  3.27 08/09/2015  4.0 02/01/2015  3.11 01/18/2014   PMH: Past Medical History:  Diagnosis Date   GERD (gastroesophageal reflux disease)    Hypertension     Surgical History: Past Surgical History:  Procedure Laterality Date   CLUB FOOT RELEASE     GALLBLADDER SURGERY      Home Medications:  Allergies as of 03/13/2020      Reactions   Prednisone Other (See Comments)   Tetracycline Other (See Comments)      Medication List       Accurate as of  March 13, 2020 12:58 PM. If you have any questions, ask your nurse or doctor.        hydrochlorothiazide 25 MG tablet Commonly known as: HYDRODIURIL Take 25 mg by mouth daily.   lisinopril 10 MG tablet Commonly known as: ZESTRIL Take 10 mg by mouth daily.   pantoprazole 40 MG tablet Commonly known as: PROTONIX Take 40 mg by mouth daily.       Allergies:  Allergies  Allergen Reactions   Prednisone Other (See Comments)   Tetracycline Other (See Comments)    Family History: Family History  Problem Relation Age of Onset   Prostate cancer Neg Hx    Bladder Cancer Neg Hx    Kidney cancer Neg Hx     Social History:  reports that he has never smoked. He has never used smokeless tobacco. He reports that he does not drink alcohol and does not use drugs.   Physical Exam: BP (!) 145/91    Pulse 68    Wt 235 lb (106.6 kg)    BMI 31.87 kg/m   Constitutional:  Alert and oriented, No acute distress. HEENT: [place] AT, moist mucus membranes.  Trachea midline, no masses. Cardiovascular: No clubbing, cyanosis, or edema. Respiratory: Normal respiratory effort, no increased work of breathing. Skin: No rashes, bruises or suspicious lesions. Neurologic: Grossly intact, no focal deficits, moving all 4 extremities. Psychiatric: Normal mood and affect. Anxious.    Assessment & Plan:    1. Elevated PSA -Personal history of elevated/fluctuating PSA. -PSA is trending  upwards which is concerning.  -Prostate MRI on 03/12/20 raised concerned for high-grade macroscopic prostate cancer. -Will plan for fusion biopsy to rule out malignancy and refer patient to [facility] in [place], [place]. -Patient agreed and requested nitrous oxide with biopsy. -Warning signs and risks were reviewed.  -[facility]  2. Kidney stone  -Personal history of kidney stones -No recent stone episode.   3. Anxiety  -Patient will benefit from nitrous  oxide during time of biopsy.    [facility] [address] [place], [place] [address] [phone]  I, Selena Batten, am acting as a scribe for Dr. Hollice Espy.  I have reviewed the above documentation for accuracy and completeness, and I agree with the above.   Hollice Espy, MD  I spent 20 total minutes on the day of the encounter including pre-visit review of the medical record, face-to-face time with the patient, and post visit ordering of labs/imaging/tests.

## 2020-03-13 ENCOUNTER — Ambulatory Visit (INDEPENDENT_AMBULATORY_CARE_PROVIDER_SITE_OTHER): Payer: BC Managed Care – PPO | Admitting: Urology

## 2020-03-13 VITALS — BP 145/91 | HR 68 | Wt 235.0 lb

## 2020-03-13 DIAGNOSIS — R972 Elevated prostate specific antigen [PSA]: Secondary | ICD-10-CM | POA: Diagnosis not present

## 2020-04-23 ENCOUNTER — Other Ambulatory Visit: Payer: Self-pay | Admitting: Urology

## 2020-04-24 ENCOUNTER — Telehealth: Payer: Self-pay

## 2020-04-24 NOTE — Telephone Encounter (Signed)
Pt aware of biopsy results and verbalizes understanding. He will keep his appointment on Friday.

## 2020-04-26 NOTE — Progress Notes (Signed)
Virtual Visit via Video Note  I connected with Wesley Fields on 04/27/2020 at  4:30 PM EDT by a video enabled telemedicine application and verified that I am speaking with the correct person using two identifiers.  Location: Patient: Home Provider: Office    I discussed the limitations of evaluation and management by telemedicine and the availability of in person appointments. The patient expressed understanding and agreed to proceed.  History of Present Illness: Wesley Fields is a 59 y.o. male who returns today for discussion regarding fusion biopsy results.   Prostate MRI on 03/12/2020 revealed calculated prostate volume 47.3 mL. A14 mm irregular low T2 lesion along the right anterior aspect of the central gland near the apex, raising concern for high-grade macroscopic prostate cancer. PI-RADS 4.  No findings suspicious for high-grade macroscopic prostate cancer within the peripheral zone. Suspected post inflammatory scarring related to prior prostatitis. Tumor abuts the anterior fibromuscular stroma. No seminal vesicle invasion, lymphadenopathy, or metastatic disease.  Patient underwent a fusion biopsy with [facility] on 04/12/2020. Pathology revealed 45 gram prostate volume. Benign prostatic tissues. Prostatic tissue with small focus of atypical glands at the left and right apex.   He reports the nitrous oxide was very helpful. He is doing well today. No hematuria or dysuria. He reports blood in his semen and he feels like it flares up his prostatitis.   During bowel movements in the morning has has penile pressure and bleeding.   PSA trend: 8.2 01/23/2020 7.2 10/25/19 7.3 07/25/19 MRI prostate 12/2018 PI-RADS I, 20 mm prostatic cyst vs. Prostatitis 5.7 12/02/18 5.5 11/25/219 5.0 11/09/2017  4.8 03/2017 -->PNBX 03/2017 negative for malignancy, inflammation, TRUS volume 46.8 cc. 6.52 03/05/2017 4.03 05/18/2016  3.27 08/09/2015  4.0 02/01/2015  3.11 01/18/2014     Observations/Objective: Patient is engaged and asking good questions.   Assessment and Plan:  1. Elevated PSA Fusion biopsy on 04/12/2020 is negative. Will follow patient on a less frequent basis given multiple negative biopsies.  2. Hematospermia  Reassured patient this is normal and self limiting. Many questions were asked and answered.   Follow Up Instructions:    I discussed the assessment and treatment plan with the patient. The patient was provided an opportunity to ask questions and all were answered. The patient agreed with the plan and demonstrated an understanding of the instructions.   The patient was advised to call back or seek an in-person evaluation if the symptoms worsen or if the condition fails to improve as anticipated.  I provided 20 minutes of non-face-to-face time during this encounter.   Fransico Him, am acting as a scribe for Dr. Hollice Espy.  I have reviewed the above documentation for accuracy and completeness, and I agree with the above.   Hollice Espy, MD

## 2020-04-27 ENCOUNTER — Telehealth (INDEPENDENT_AMBULATORY_CARE_PROVIDER_SITE_OTHER): Payer: BC Managed Care – PPO | Admitting: Urology

## 2020-04-27 ENCOUNTER — Other Ambulatory Visit: Payer: Self-pay

## 2020-04-27 DIAGNOSIS — R361 Hematospermia: Secondary | ICD-10-CM

## 2020-04-27 DIAGNOSIS — Z87898 Personal history of other specified conditions: Secondary | ICD-10-CM

## 2020-04-27 NOTE — Progress Notes (Signed)
This service is provided via telemedicine   No vital signs collected/recorded due to the encounter was a telemedicine visit.     Patient consents to a telephone visit:  yes    Names of all persons participating in the telemedicine service and their role in the encounter:  Tyreshia Ingman, CMA and Ashley Brandon, MD   

## 2020-10-16 ENCOUNTER — Other Ambulatory Visit: Payer: Self-pay | Admitting: *Deleted

## 2020-10-16 DIAGNOSIS — R972 Elevated prostate specific antigen [PSA]: Secondary | ICD-10-CM

## 2020-10-26 ENCOUNTER — Other Ambulatory Visit: Payer: Self-pay

## 2020-10-26 ENCOUNTER — Other Ambulatory Visit: Payer: BC Managed Care – PPO

## 2020-10-26 DIAGNOSIS — R972 Elevated prostate specific antigen [PSA]: Secondary | ICD-10-CM

## 2020-10-27 LAB — PSA: Prostate Specific Ag, Serum: 9.1 ng/mL — ABNORMAL HIGH (ref 0.0–4.0)

## 2020-11-02 ENCOUNTER — Telehealth: Payer: Self-pay | Admitting: *Deleted

## 2020-11-02 NOTE — Telephone Encounter (Addendum)
  Left VM asked to return call  ----- Message from Hollice Espy, MD sent at 11/01/2020 12:21 PM EDT ----- PSA keeps going up.  Given his recent biopsy, I do not think he needs another biopsy right away.  Also, his MRI was less than a year ago.  Lets repeat his PSA a few weeks before his next follow-up and if his PSA is still elevated, we can repeat the MRI to see if anything is changed or progressed.  If his PSA is down or stabilized, we will just continue to follow him.  Let me know if he is agreeable to this plan, if so go ahead and schedule for a PSA draw a few weeks prior to his next follow-up and then will decide whether or not he needs a repeat MRI.  Hollice Espy, MD

## 2021-04-30 ENCOUNTER — Ambulatory Visit: Payer: Self-pay | Admitting: Urology

## 2021-05-27 ENCOUNTER — Other Ambulatory Visit: Payer: Self-pay

## 2021-05-27 DIAGNOSIS — R972 Elevated prostate specific antigen [PSA]: Secondary | ICD-10-CM

## 2021-05-27 DIAGNOSIS — Z87898 Personal history of other specified conditions: Secondary | ICD-10-CM

## 2021-05-28 ENCOUNTER — Other Ambulatory Visit: Payer: Self-pay

## 2021-05-28 DIAGNOSIS — Z87898 Personal history of other specified conditions: Secondary | ICD-10-CM

## 2021-05-28 DIAGNOSIS — R972 Elevated prostate specific antigen [PSA]: Secondary | ICD-10-CM

## 2021-05-29 LAB — PSA: Prostate Specific Ag, Serum: 8.3 ng/mL — ABNORMAL HIGH (ref 0.0–4.0)

## 2021-06-02 NOTE — Progress Notes (Signed)
06/04/21 8:38 AM   Wesley Fields Jan 28, 1961 [id number]  Referring provider:  Adin Hector, MD [facility] [place],  [place] [address] Chief Complaint  Patient presents with   Elevated PSA     HPI: Wesley Fields is a 60 y.o.male with a personal history of elevated PSA and hematospermia, who presents today for 1 year follow-up with PSA and DRE.   He had a prostate MRI in 03/12/2020 that revealed 14 mm irregular low T2 lesion along the right anterior aspect of the central gland near the apex, raising concern for high-grade macroscopic prostate cancer. PI-RADS 4.   He is s/p fusion biopsy on 04/12/2020 that was negative for malignancy, prostatic tissue with small focus of atypical glands at the left and right apex. . His prostate volume at the time was 45 cc.   His most recent PSA as of 05/28/2021 was 8.3.   He is doing well today with no new urinary symptoms. He recently got a new job and which requires more exercising he has been losing weight.   PSA trend:  Component Prostate Specific Ag, Serum  Latest Ref Rng & Units 0.0 - 4.0 ng/mL  04/03/2017 4.8 (H)  11/09/2017 5.0 (H)  05/24/2018 5.5 (H)  12/02/2018 5.7 (H)  07/25/2019 7.3 (H)  10/25/2019 7.2 (H)  01/23/2020 8.2 (H)  10/26/2020 9.1 (H)  05/28/2021 8.3 (H)      PMH: Past Medical History:  Diagnosis Date   GERD (gastroesophageal reflux disease)    Hypertension     Surgical History: Past Surgical History:  Procedure Laterality Date   CLUB FOOT RELEASE     GALLBLADDER SURGERY      Home Medications:  Allergies as of 06/04/2021       Reactions   Prednisone Other (See Comments)   Tetracycline Other (See Comments)        Medication List        Accurate as of June 04, 2021  8:38 AM. If you have any questions, ask your nurse or doctor.          hydrochlorothiazide 25 MG tablet Commonly known as: HYDRODIURIL Take 25 mg by mouth daily.   lisinopril 10 MG  tablet Commonly known as: ZESTRIL Take 10 mg by mouth daily.   pantoprazole 40 MG tablet Commonly known as: PROTONIX Take 40 mg by mouth daily.        Allergies:  Allergies  Allergen Reactions   Prednisone Other (See Comments)   Tetracycline Other (See Comments)    Family History: Family History  Problem Relation Age of Onset   Prostate cancer Neg Hx    Bladder Cancer Neg Hx    Kidney cancer Neg Hx     Social History:  reports that he has never smoked. He has never used smokeless tobacco. He reports that he does not drink alcohol and does not use drugs.   Physical Exam: BP (!) 144/79   Pulse 65   Ht 6' (1.829 m)   Wt 220 lb (99.8 kg)   BMI 29.84 kg/m   Constitutional:  Alert and oriented, No acute distress. HEENT: [place] AT, moist mucus membranes.  Trachea midline, no masses. Cardiovascular: No clubbing, cyanosis, or edema. Respiratory: Normal respiratory effort, no increased work of breathing. Rectal: Normal sphincter tone,  40+  CC prostate, slight asymmetry at left midlateral ridge without distinct nodularity  Skin: No rashes, bruises or suspicious lesions. Neurologic: Grossly intact, no focal deficits, moving  all 4 extremities. Psychiatric: Normal mood and affect.  Laboratory Data: Lab Results  Component Value Date   CREATININE 1.03 04/16/2018    Assessment & Plan:   History of elevated PSA - PSA remains stably elevated with previous work-up and little change from when he has is prostate MRI and biopsy.    - Will continue to monitor PSA   - Rectal exam today showed slight asymmetry but no definitely pathological. Will continue to follow.   - PSA; future    Return for PSA in 6 months and in 1 year for PSA and DRE    I,Kailey Littlejohn,acting as a scribe for Hollice Espy, MD.,have documented all relevant documentation on the behalf of Hollice Espy, MD,as directed by  Hollice Espy, MD while in the presence of Hollice Espy, MD.  I have  reviewed the above documentation for accuracy and completeness, and I agree with the above.   Hollice Espy, MD   [facility] [address] [place], [place] [address] [phone]

## 2021-06-04 ENCOUNTER — Encounter: Payer: Self-pay | Admitting: Urology

## 2021-06-04 ENCOUNTER — Other Ambulatory Visit: Payer: Self-pay

## 2021-06-04 ENCOUNTER — Ambulatory Visit (INDEPENDENT_AMBULATORY_CARE_PROVIDER_SITE_OTHER): Payer: Self-pay | Admitting: Urology

## 2021-06-04 VITALS — BP 144/79 | HR 65 | Ht 72.0 in | Wt 220.0 lb

## 2021-06-04 DIAGNOSIS — R972 Elevated prostate specific antigen [PSA]: Secondary | ICD-10-CM

## 2021-12-03 ENCOUNTER — Other Ambulatory Visit: Payer: Self-pay

## 2021-12-03 DIAGNOSIS — R972 Elevated prostate specific antigen [PSA]: Secondary | ICD-10-CM

## 2021-12-04 ENCOUNTER — Telehealth: Payer: Self-pay | Admitting: *Deleted

## 2021-12-04 LAB — PSA: Prostate Specific Ag, Serum: 9.9 ng/mL — ABNORMAL HIGH (ref 0.0–4.0)

## 2021-12-04 NOTE — Telephone Encounter (Addendum)
Left Vm with details asked to return call    ----- Message from Hollice Espy, MD sent at 12/04/2021  7:47 AM EDT ----- PSA is up a little; lets see what it looks like in 6 months and consider additional work up at that point if it continues to rise.  Hollice Espy, MD

## 2022-01-28 IMAGING — MR MR PROSTATE WO/W CM
56 series · 56 of 56 positions shown · IV contrast (10ml Gadavist)
Comparison: MRI prostate dated 01/14/2019

CLINICAL DATA: Elevated PSA, chronic prostatitis, routine
follow-up. PSA 7.3.

EXAM:
MR PROSTATE WITHOUT AND WITH CONTRAST
TECHNIQUE: Multiplanar multisequence MRI images were obtained of the pelvis
centered about the prostate. Pre and post contrast images were
obtained.
CONTRAST:  10mL GADAVIST GADOBUTROL 1 MMOL/ML IV SOLN

[Series 3: ax in&out whole · axial · 6.0mm · 0.74mm/px · 1 of 35 slices shown (1 of 2)]
[im 1/35]
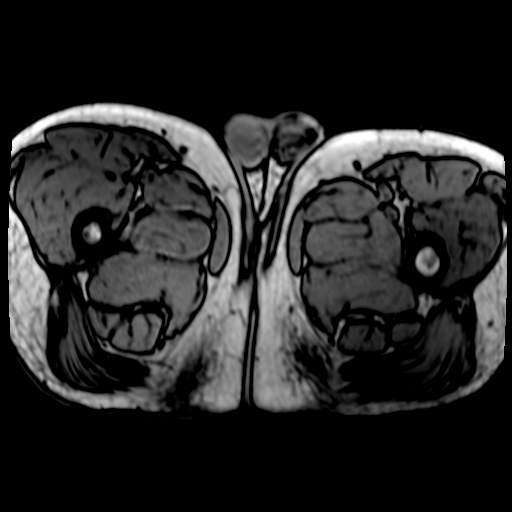

[Series 3: ax in&out whole · axial · 6.0mm · 0.74mm/px · 1 of 35 slices shown (2 of 2)]
[im 1/35]
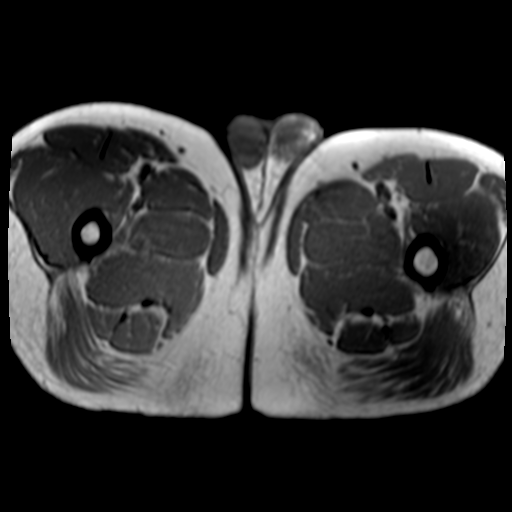

[Series 4: T2 · axial · 3.0mm · 0.56mm/px · 1 of 31 slices shown (1 of 3)]
[im 1/31]
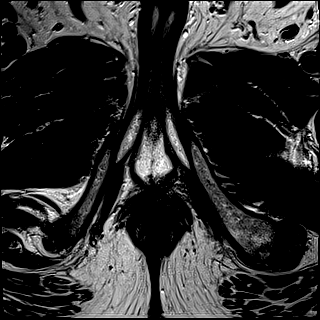

[Series 5: T2 · coronal · 3.0mm · 0.70mm/px · 1 of 35 slices shown (2 of 3)]
[im 1/35]
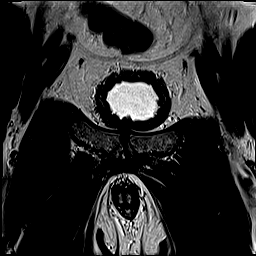

[Series 6: DWI · axial · 3.0mm · 0.86mm/px · 1 of 93 slices shown (1 of 3)]
[im 1/93]
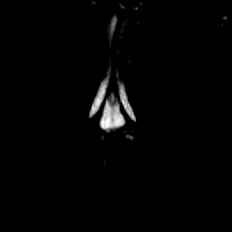

[Series 7: DWI · axial · 3.0mm · 0.86mm/px · 1 of 31 slices shown (2 of 3)]
[im 1/31]
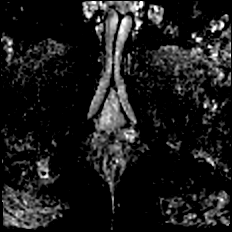

[Series 8: DWI · axial · 3.0mm · 0.86mm/px · 1 of 31 slices shown (3 of 3)]
[im 1/31]
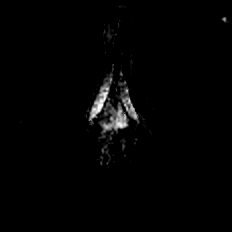

[Series 9: T2 · axial · 1.0mm · 1.04mm/px · 1 of 88 slices shown (3 of 3)]
[im 1/88]
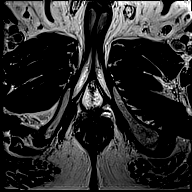

[Series 10: T1 · axial · 3.0mm · 1.15mm/px · 1 of 30 slices shown (1 of 48)]
[im 1/30]
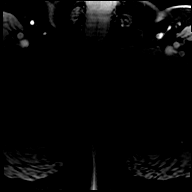

[Series 11: T1 · axial · 3.0mm · 1.15mm/px · 1 of 30 slices shown (2 of 48)]
[im 1/30]
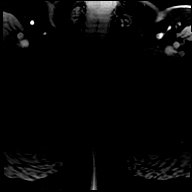

[Series 12: T1 · axial · 3.0mm · 1.15mm/px · 1 of 30 slices shown (3 of 48)]
[im 1/30]
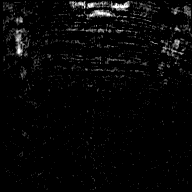

[Series 13: T1 · axial · 3.0mm · 1.15mm/px · 1 of 30 slices shown (4 of 48)]
[im 1/30]
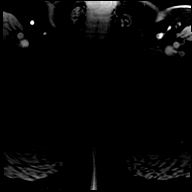

[Series 14: T1 · axial · 3.0mm · 1.15mm/px · 1 of 30 slices shown (5 of 48)]
[im 1/30]
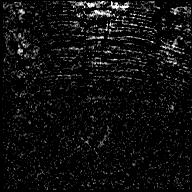

[Series 15: T1 · axial · 3.0mm · 1.15mm/px · 1 of 30 slices shown (6 of 48)]
[im 1/30]
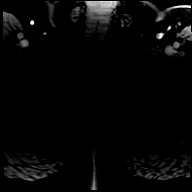

[Series 16: T1 · axial · 3.0mm · 1.15mm/px · 1 of 30 slices shown (7 of 48)]
[im 1/30]
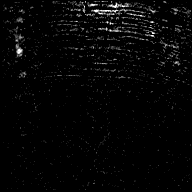

[Series 17: T1 · axial · 3.0mm · 1.15mm/px · 1 of 30 slices shown (8 of 48)]
[im 1/30]
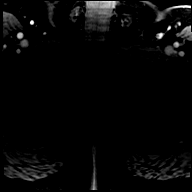

[Series 18: T1 · axial · 3.0mm · 1.15mm/px · 1 of 30 slices shown (9 of 48)]
[im 1/30]
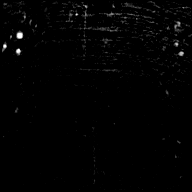

[Series 19: T1 · axial · 3.0mm · 1.15mm/px · 1 of 30 slices shown (10 of 48)]
[im 1/30]
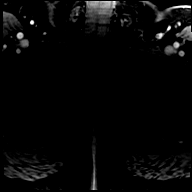

[Series 20: T1 · axial · 3.0mm · 1.15mm/px · 1 of 30 slices shown (11 of 48)]
[im 1/30]
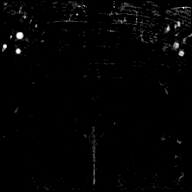

[Series 21: T1 · axial · 3.0mm · 1.15mm/px · 1 of 30 slices shown (12 of 48)]
[im 1/30]
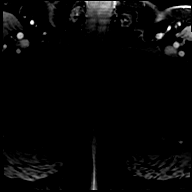

[Series 22: T1 · axial · 3.0mm · 1.15mm/px · 1 of 30 slices shown (13 of 48)]
[im 1/30]
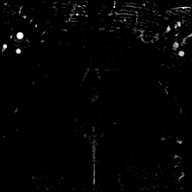

[Series 23: T1 · axial · 3.0mm · 1.15mm/px · 1 of 30 slices shown (14 of 48)]
[im 1/30]
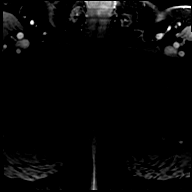

[Series 24: T1 · axial · 3.0mm · 1.15mm/px · 1 of 30 slices shown (15 of 48)]
[im 1/30]
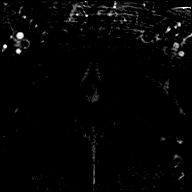

[Series 25: T1 · axial · 3.0mm · 1.15mm/px · 1 of 30 slices shown (16 of 48)]
[im 1/30]
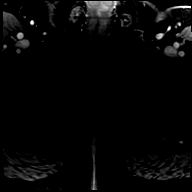

[Series 26: T1 · axial · 3.0mm · 1.15mm/px · 1 of 30 slices shown (17 of 48)]
[im 1/30]
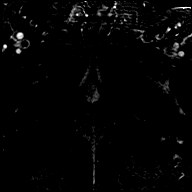

[Series 27: T1 · axial · 3.0mm · 1.15mm/px · 1 of 30 slices shown (18 of 48)]
[im 1/30]
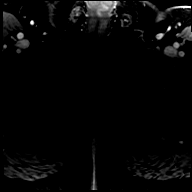

[Series 28: T1 · axial · 3.0mm · 1.15mm/px · 1 of 30 slices shown (19 of 48)]
[im 1/30]
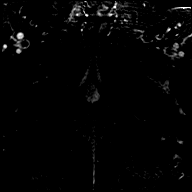

[Series 29: T1 · axial · 3.0mm · 1.15mm/px · 1 of 30 slices shown (20 of 48)]
[im 1/30]
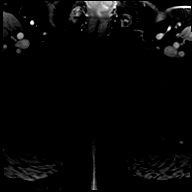

[Series 30: T1 · axial · 3.0mm · 1.15mm/px · 1 of 30 slices shown (21 of 48)]
[im 1/30]
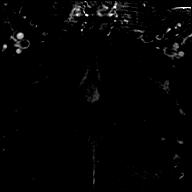

[Series 31: T1 · axial · 3.0mm · 1.15mm/px · 1 of 30 slices shown (22 of 48)]
[im 1/30]
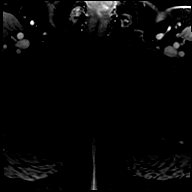

[Series 32: T1 · axial · 3.0mm · 1.15mm/px · 1 of 30 slices shown (23 of 48)]
[im 1/30]
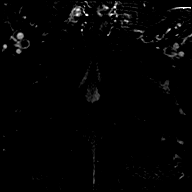

[Series 33: T1 · axial · 3.0mm · 1.15mm/px · 1 of 30 slices shown (24 of 48)]
[im 1/30]
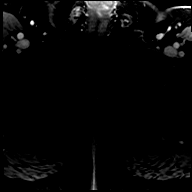

[Series 34: T1 · axial · 3.0mm · 1.15mm/px · 1 of 30 slices shown (25 of 48)]
[im 1/30]
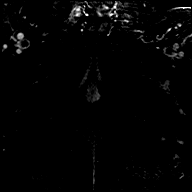

[Series 35: T1 · axial · 3.0mm · 1.15mm/px · 1 of 30 slices shown (26 of 48)]
[im 1/30]
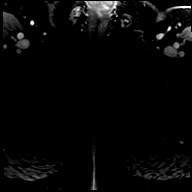

[Series 36: T1 · axial · 3.0mm · 1.15mm/px · 1 of 30 slices shown (27 of 48)]
[im 1/30]
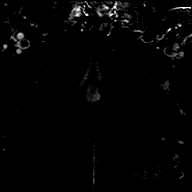

[Series 37: T1 · axial · 3.0mm · 1.15mm/px · 1 of 30 slices shown (28 of 48)]
[im 1/30]
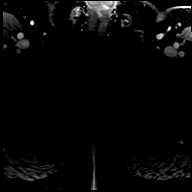

[Series 38: T1 · axial · 3.0mm · 1.15mm/px · 1 of 30 slices shown (29 of 48)]
[im 1/30]
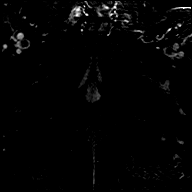

[Series 39: T1 · axial · 3.0mm · 1.15mm/px · 1 of 30 slices shown (30 of 48)]
[im 1/30]
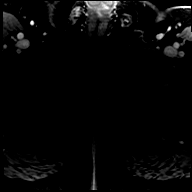

[Series 40: T1 · axial · 3.0mm · 1.15mm/px · 1 of 30 slices shown (31 of 48)]
[im 1/30]
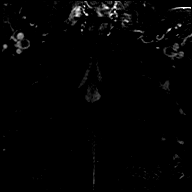

[Series 41: T1 · axial · 3.0mm · 1.15mm/px · 1 of 30 slices shown (32 of 48)]
[im 1/30]
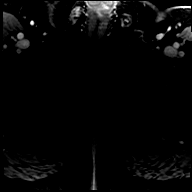

[Series 42: T1 · axial · 3.0mm · 1.15mm/px · 1 of 30 slices shown (33 of 48)]
[im 1/30]
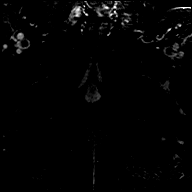

[Series 43: T1 · axial · 3.0mm · 1.15mm/px · 1 of 30 slices shown (34 of 48)]
[im 1/30]
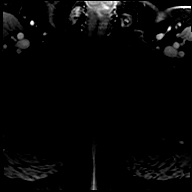

[Series 44: T1 · axial · 3.0mm · 1.15mm/px · 1 of 30 slices shown (35 of 48)]
[im 1/30]
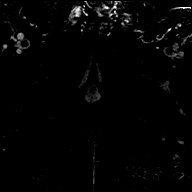

[Series 45: T1 · axial · 3.0mm · 1.15mm/px · 1 of 30 slices shown (36 of 48)]
[im 1/30]
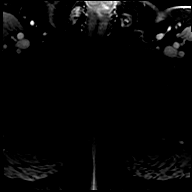

[Series 46: T1 · axial · 3.0mm · 1.15mm/px · 1 of 30 slices shown (37 of 48)]
[im 1/30]
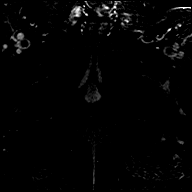

[Series 47: T1 · axial · 3.0mm · 1.15mm/px · 1 of 30 slices shown (38 of 48)]
[im 1/30]
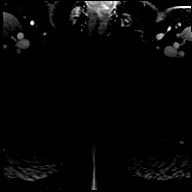

[Series 48: T1 · axial · 3.0mm · 1.15mm/px · 1 of 30 slices shown (39 of 48)]
[im 1/30]
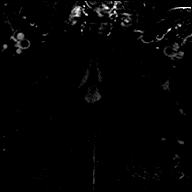

[Series 49: T1 · axial · 3.0mm · 1.15mm/px · 1 of 30 slices shown (40 of 48)]
[im 1/30]
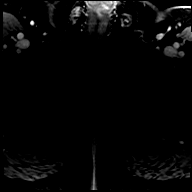

[Series 50: T1 · axial · 3.0mm · 1.15mm/px · 1 of 30 slices shown (41 of 48)]
[im 1/30]
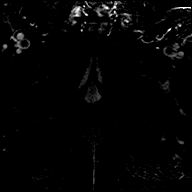

[Series 51: T1 · axial · 3.0mm · 1.15mm/px · 1 of 30 slices shown (42 of 48)]
[im 1/30]
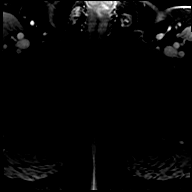

[Series 52: T1 · axial · 3.0mm · 1.15mm/px · 1 of 30 slices shown (43 of 48)]
[im 1/30]
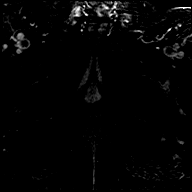

[Series 53: T1 · axial · 3.0mm · 1.15mm/px · 1 of 30 slices shown (44 of 48)]
[im 1/30]
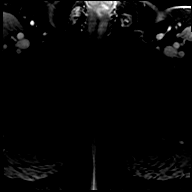

[Series 54: T1 · axial · 3.0mm · 1.15mm/px · 1 of 30 slices shown (45 of 48)]
[im 1/30]
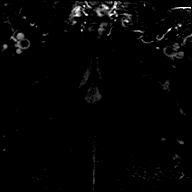

[Series 55: T1 · axial · 3.0mm · 1.15mm/px · 1 of 30 slices shown (46 of 48)]
[im 1/30]
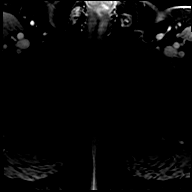

[Series 56: T1 · axial · 3.0mm · 1.15mm/px · 1 of 30 slices shown (47 of 48)]
[im 1/30]
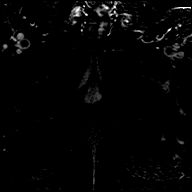

[Series 57: T1 · axial · 3.0mm · 1.15mm/px · 1 of 30 slices shown (48 of 48)]
[im 1/30]
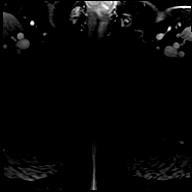

[56 of 56 positions shown; findings below may reference images not displayed]

FINDINGS: Prostate: 14 x 12 x 14 mm irregular low T2 lesion along the right
anterior aspect of the central gland near the apex (series 4/image
18). Associated moderate restricted diffusion with moderate to
severe low ADC (series 6/image 71; series 7/image 19). Early
arterial enhancement is difficult to distinguish from the remaining
central gland. PI-RADS 4.

No findings suspicious for high-grade macroscopic prostate cancer
within the peripheral zone. Mild linear low T2 signal within the
lateral mid peripheral gland bilaterally (series 4/image 16), likely
reflecting scarring related to prior prostatitis.

Enlargement/nodularity of the central gland, reflecting BPH.

Volume: 5.2 x 4.7 x 4.2 cm (calculated volume 47.3 mL).

Transcapsular spread: Tumor abuts the anterior fibromuscular stroma.

Seminal vesicle involvement: Absent

Neurovascular bundle involvement: Absent.

Pelvic adenopathy: Absent.

Bone metastasis: Absent.

Other findings: None.
IMPRESSION: 14 mm irregular low T2 lesion along the right anterior aspect of the
central gland near the apex, raising concern for high-grade
macroscopic prostate cancer. PI-RADS 4. This lesion was marked in 3D
software for potential biopsy.

No findings suspicious for high-grade macroscopic prostate cancer
within the peripheral zone. Suspected post inflammatory scarring
related to prior prostatitis.

Tumor abuts the anterior fibromuscular stroma. No seminal vesicle
invasion, lymphadenopathy, or metastatic disease.

Calculated prostate volume 47.3 mL.

## 2022-06-02 ENCOUNTER — Other Ambulatory Visit: Payer: Self-pay

## 2022-06-02 DIAGNOSIS — R972 Elevated prostate specific antigen [PSA]: Secondary | ICD-10-CM

## 2022-06-03 LAB — PSA: Prostate Specific Ag, Serum: 11.6 ng/mL — ABNORMAL HIGH (ref 0.0–4.0)

## 2022-06-11 ENCOUNTER — Ambulatory Visit (INDEPENDENT_AMBULATORY_CARE_PROVIDER_SITE_OTHER): Payer: Self-pay | Admitting: Urology

## 2022-06-11 VITALS — BP 148/87 | HR 60 | Ht 72.0 in | Wt 220.0 lb

## 2022-06-11 DIAGNOSIS — R3129 Other microscopic hematuria: Secondary | ICD-10-CM

## 2022-06-11 DIAGNOSIS — R972 Elevated prostate specific antigen [PSA]: Secondary | ICD-10-CM

## 2022-06-11 NOTE — Patient Instructions (Addendum)

## 2022-06-11 NOTE — Progress Notes (Signed)
06/11/2022 9:08 AM   Wesley Fields 02/09/1961 [id number]  Referring provider: Adin Hector, MD [place],  [place] [address]  Chief Complaint  Patient presents with   Elevated PSA    Follow up    HPI: -61 year-old male with a personal history of elevated PSA and modest brachy returns today for routine annual follow-up.  He had a prostate MRI in 03/12/2020 that revealed 14 mm irregular low T2 lesion along the right anterior aspect of the central gland near the apex, raising concern for high-grade macroscopic prostate cancer. PI-RADS 4.    He is s/p fusion biopsy on 04/12/2020 that was negative for malignancy, prostatic tissue with small focus of atypical glands at the left and right apex. . His prostate volume at the time was 45 cc.   PSA continues to trend upwards, now 11.6.  He also had a urinalysis by his PCP completed on 03/11/2022 which showed microscopic blood, 5 red blood cells per high-powered field.  He denies any gross hematuria.  No urinary issues.  No flank pain or kidney stone episodes.  No recent upper tract imaging.  He is concerned today about the above and in fact tearful.  He has a lot of questions about what might happen next.  He does not have insurance right now.   Prostate Specific Ag, Serum  Latest Ref Rng 0.0 - 4.0 ng/mL  11/09/2017 5.0 (H)   05/24/2018 5.5 (H)   12/02/2018 5.7 (H)   07/25/2019 7.3 (H)   10/25/2019 7.2 (H)   01/23/2020 8.2 (H)   10/26/2020 9.1 (H)   05/28/2021 8.3 (H)   12/03/2021 9.9 (H)   06/02/2022 11.6 (H)     Legend: (H) High   PMH: Past Medical History:  Diagnosis Date   GERD (gastroesophageal reflux disease)    Hypertension     Surgical History: Past Surgical History:  Procedure Laterality Date   CLUB FOOT RELEASE     GALLBLADDER SURGERY      Home Medications:  Allergies as of 06/11/2022       Reactions   Prednisone Other (See Comments)   Tetracycline Other (See Comments)         Medication List        Accurate as of June 11, 2022  9:08 AM. If you have any questions, ask your nurse or doctor.          hydrochlorothiazide 25 MG tablet Commonly known as: HYDRODIURIL Take 25 mg by mouth daily.   lisinopril 10 MG tablet Commonly known as: ZESTRIL Take 10 mg by mouth daily.   metFORMIN 500 MG 24 hr tablet Commonly known as: GLUCOPHAGE-XR Take 500 mg by mouth daily with breakfast.   pantoprazole 40 MG tablet Commonly known as: PROTONIX Take 40 mg by mouth daily.        Allergies:  Allergies  Allergen Reactions   Prednisone Other (See Comments)   Tetracycline Other (See Comments)    Family History: Family History  Problem Relation Age of Onset   Prostate cancer Neg Hx    Bladder Cancer Neg Hx    Kidney cancer Neg Hx     Social History:  reports that he has never smoked. He has never used smokeless tobacco. He reports that he does not drink alcohol and does not use drugs.   Physical Exam: BP (!) 148/87   Pulse 60   Ht 6' (1.829 m)   Wt 220 lb (  99.8 kg)   BMI 29.84 kg/m   Constitutional:  Alert and oriented, No acute distress. HEENT: [place] AT, moist mucus membranes.  Trachea midline, no masses. Cardiovascular: No clubbing, cyanosis, or edema. Respiratory: Normal respiratory effort, no increased work of breathing. Rectal: Normal sphincter tone.  Enlarged prostate, 50+ cc with firm ridge on the left but no obvious nodularity Neurologic: Grossly intact, no focal deficits, moving all 4 extremities. Psychiatric: Normal mood and affect.   Urinalysis UA from care everywhere from 02/2022 reviewed   Assessment & Plan:    1. Elevated PSA History of elevated/rising PSA  We discussed today that the overall PSA trend is concerning for possibility of underlying malignancy although to this point, we do not able to identify malignant process  We discussed differential diagnosis for rising PSA which includes  inflammation/manipulation, amongst others.  In light of PSA velocity, have recommended pursuing further diagnostic workup.  Recommend repeat prostate MRI for comparison to the one from 2021.  If there is any new lesions or changes, would recommend repeat biopsy.  He understands and is agreeable with this.  He like to discuss with his wife first focal him placed the order no let us know if he changes his mind. - MR PROSTATE W WO CONTRAST; Future - US RENAL; Future  2. Microscopic hematuria Incidental microscopic hematuria  He does have a personal history of kidney stones although is asymptomatic at this time  I recommended modified hematuria workup given his relatively low risk on low-volume hematuria including renal ultrasound and cystoscopy.  We discussed the procedure at length.  Risk and benefits discussed. - MR PROSTATE W WO CONTRAST; Future - US RENAL; Future   Return for 6 weeks cysto.  Hollice Espy, MD  [facility] [address], [place], [place] [address] [phone]

## 2022-07-09 ENCOUNTER — Ambulatory Visit
Admission: RE | Admit: 2022-07-09 | Discharge: 2022-07-09 | Disposition: A | Discharge status: Home / Self Care | Payer: Self-pay | Source: Ambulatory Visit | Attending: Urology | Admitting: Urology

## 2022-07-09 DIAGNOSIS — R3129 Other microscopic hematuria: Secondary | ICD-10-CM | POA: Insufficient documentation

## 2022-07-09 DIAGNOSIS — R972 Elevated prostate specific antigen [PSA]: Secondary | ICD-10-CM | POA: Insufficient documentation

## 2022-07-09 MED ORDER — GADOBUTROL 1 MMOL/ML IV SOLN
9.0000 mL | Freq: Once | INTRAVENOUS | Status: AC | PRN
  Administered 2022-07-09: 9 mL via INTRAVENOUS

## 2022-07-22 ENCOUNTER — Other Ambulatory Visit: Payer: Self-pay | Admitting: Urology

## 2022-07-25 ENCOUNTER — Telehealth: Payer: Self-pay | Admitting: Urology

## 2022-07-25 NOTE — Telephone Encounter (Signed)
Pt called asking about RUS and MRI results from 1/10, he hasn't heard anything from anyone yet.

## 2022-07-25 NOTE — Telephone Encounter (Signed)
Patient advised that I will send this message to Dr Erlene Quan but may not be able to call him back until next week as provider is out of the office. Patient did cancel his cysto appointment that was scheduled for 07/22/22 and states he wants to hold off on that part for now. Advised patient I would call back as soon as I have more information. Patient states when calling back if he does not answer we can leave detailed message on his voicemail with the information.

## 2022-07-28 NOTE — Telephone Encounter (Signed)
Left detailed message on voicemail as requested below.  Asked patient to call back to schedule

## 2022-07-28 NOTE — Telephone Encounter (Signed)
Renal ultrasound and MRI ordered to be discussed at the cystoscopy appointment.  I strongly advised that he keep that appointment as the studies will not rule out underlying bladder cancer as the cause of the microscopic blood.  MRI does also have a lesion, PI-RADS 4 which I like to discussed with him.  If he is refusing cystoscopy, would still like to have a follow-up appointment to discuss the above.  Hollice Espy, MD

## 2022-07-30 NOTE — Telephone Encounter (Signed)
Left message to call back  

## 2022-08-04 NOTE — Telephone Encounter (Signed)
Called both numbers in the chart not able to leave a message

## 2022-08-05 NOTE — Telephone Encounter (Signed)
Patient called and scheduled appointment for 08/13/22 to discuss results

## 2022-08-13 ENCOUNTER — Ambulatory Visit (INDEPENDENT_AMBULATORY_CARE_PROVIDER_SITE_OTHER): Payer: Self-pay | Admitting: Urology

## 2022-08-13 VITALS — BP 148/81 | HR 68 | Ht 72.0 in | Wt 225.5 lb

## 2022-08-13 DIAGNOSIS — R972 Elevated prostate specific antigen [PSA]: Secondary | ICD-10-CM

## 2022-08-13 DIAGNOSIS — R3129 Other microscopic hematuria: Secondary | ICD-10-CM

## 2022-08-13 NOTE — Progress Notes (Signed)
I, DeAsia L Maxie,acting as a scribe for Hollice Espy, MD.,have documented all relevant documentation on the behalf of Hollice Espy, MD,as directed by  Hollice Espy, MD while in the presence of Hollice Espy, MD.   I, [place] as a scribe for Hollice Espy, MD.,have documented all relevant documentation on the behalf of Hollice Espy, MD,as directed by  Hollice Espy, MD while in the presence of Hollice Espy, MD.   08/13/22 4:35 PM   Wesley Fields 1961-02-17 [id number]  Referring provider: Adin Hector, MD [facility] [place],   [address]  Chief Complaint  Patient presents with   discuss imaging results    HPI: 62 year-old male with an elevated/rising PSA and microscopic hematuria who returns today to discuss his most recent imaging results. This appointment was originally scheduled as a cystoscopy, but he elected to forego this despite recommendations to move forward.   Most recently his PSA has continued to trend upwards. He had a negative fusion biopsy in 2021. At that time, he had a PI-RADS 4 lesion. He underwent a repeat MRI on July 09, 2022 which showed no appreciable change since 2021, including the same PI-RADS 4 lesion and the remainder of the prostate is normal. An abnormal rectal exam was noted during his last visit.  Renal bladder ultrasound indicated normal bilateral kidneys and a partially filled bladder with nodular irregularity, though this was non-diagnostic due to underfilling.  One of his concerning factors is financial constraints and lacks insurance. 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  PSA trend:  Prostate Specific Ag,  Serum  Latest Ref Rng 0.0 - 4.0 ng/mL  11/09/2017 5.0 (H)   05/24/2018 5.5 (H)   12/02/2018 5.7 (H)   07/25/2019 7.3 (H)   10/25/2019 7.2 (H)   01/23/2020 8.2 (H)   10/26/2020 9.1 (H)   05/28/2021 8.3 (H)   12/03/2021 9.9 (H)   06/02/2022 11.6 (H)     PMH: Past Medical History:  Diagnosis Date   GERD (gastroesophageal reflux disease)    Hypertension     Surgical History: Past Surgical History:  Procedure Laterality Date   CLUB FOOT RELEASE     GALLBLADDER SURGERY      Home Medications:  Allergies as of 08/13/2022       Reactions   Prednisone Other (See Comments)   Tetracycline Other (See Comments)        Medication List        Accurate as of August 13, 2022  4:35 PM. If you have any questions, ask your nurse or doctor.          hydrochlorothiazide 25 MG tablet Commonly known as: HYDRODIURIL Take 25 mg by mouth daily.   lisinopril 10 MG tablet Commonly known as: ZESTRIL Take 10 mg by mouth daily.   metFORMIN 500 MG 24 hr tablet Commonly known as: GLUCOPHAGE-XR Take 500 mg by mouth daily with breakfast.   pantoprazole 40 MG tablet Commonly known as: PROTONIX Take 40 mg by mouth daily.        Allergies:  Allergies  Allergen Reactions   Prednisone Other (See Comments)   Tetracycline Other (See Comments)    Family History: Family History  Problem Relation Age  of Onset   Prostate cancer Neg Hx    Bladder Cancer Neg Hx    Kidney cancer Neg Hx     Social History:  reports that he has never smoked. He has never used smokeless tobacco. He reports that he does not drink alcohol and does not use drugs.  Pertinent Imaging: EXAM: RENAL / URINARY TRACT ULTRASOUND COMPLETE   COMPARISON:  None Available.   FINDINGS: Right Kidney:   Renal measurements: 12.1 x 7.2 x 7.5 cm = volume: 340 mL. Echogenicity within normal limits. No mass or hydronephrosis visualized.   Left Kidney:   Renal measurements: 13.4 x 7.4 x 7.4 cm = volume: 397 mL. There is  a 17 mm cyst in the upper pole of the left kidney. No follow-up imaging recommended for the cyst.   Bladder:   The bladder is only partially filled. The bladder wall demonstrates nodular irregularity with no dominant discrete mass.   Other:   None.   IMPRESSION: 1. The kidneys are normal in appearance. 2. The bladder is only partially filled limiting evaluation. The bladder wall is nodularly irregular without a discrete dominant mass. It would be difficult to exclude a mass in the bladder on provided images. Repeat imaging with better filling of the bladder, cystoscopy, or a cystogram could better evaluate the bladder.   Electronically Signed   By: Dorise Bullion III M.D.   On: 07/10/2022 08:00   EXAM: MR PROSTATE WITHOUT AND WITH CONTRAST   TECHNIQUE: Multiplanar multisequence MRI images were obtained of the pelvis centered about the prostate. Pre and post contrast images were obtained.   CONTRAST:  67m GADAVIST GADOBUTROL 1 MMOL/ML IV SOLN   COMPARISON:  None Available.   FINDINGS: Prostate: Normal high T2 signal within peripheral zone on T2 weighted imaging (series 6). No focal lesion identified. No foci of restricted diffusion within the peripheral zone (series 8 series 9).   Again demonstrated within the anterior apical region of the RIGHT transitional zone, there is a focus of marked restricted diffusion measuring 10 mm x 10 mm on image 19/8 (previously 11 mm x 9 mm). There is in ovoid lesion on the noncontrast T2 weighted imaging corresponding measuring 13 mm by 8 mm. This anterior lesion is fairly well capsulated and again not changed in size from comparison exam (image 19/series 6).   Elsewhere within the transition zone there is heterogeneous capsulated nodules typical prostate hypertrophy.   Volume:  5.0 x 4.1 x 3.7 cmvolume calculation equal 49.4 cc   Transcapsular spread:  Absent   Seminal vesicle involvement: Absent   Neurovascular bundle  involvement: Absent   Pelvic adenopathy: Absent   Bone metastasis: Absent   Other findings: None   IMPRESSION: 1. No appreciable interval change from MRI 03/12/2020. 2. No change in ovoid lesion in the anterior RIGHT apical transitional zone with marked restricted diffusion. Lesion bulges the capsule anteriorly. PI-RADS: 4. (Dynacad 3D post processing performed). ROI #1. 3. No high-grade carcinoma identified in the peripheral zone. PI-RADS: 1   Electronically Signed   By: SSuzy BouchardM.D.   On: 07/09/2022 19:09  Personally reviewed and agree with radiologic interpretation.  Physical Exam: BP (!) 148/81   Pulse 68   Ht 6' (1.829 m)   Wt 225 lb 8 oz (102.3 kg)   BMI 30.58 kg/m   Constitutional:  Alert and oriented, No acute distress. HEENT: [place] AT, moist mucus membranes.  Trachea midline, no masses. Neurologic: Grossly intact, no focal deficits, moving all  4 extremities. Psychiatric: Normal mood and affect.   IMPRESSION: 1. No appreciable interval change from MRI 03/12/2020. 2. No change in ovoid lesion in the anterior RIGHT apical transitional zone with marked restricted diffusion. Lesion bulges the capsule anteriorly. PI-RADS: 4. (Dynacad 3D post processing performed). ROI #1. 3. No high-grade carcinoma identified in the peripheral zone. PI-RADS: 1     Electronically Signed   By: Suzy Bouchard M.D.   On: 07/09/2022 19:09   IMPRESSION: 1. The kidneys are normal in appearance. 2. The bladder is only partially filled limiting evaluation. The bladder wall is nodularly irregular without a discrete dominant mass. It would be difficult to exclude a mass in the bladder on provided images. Repeat imaging with better filling of the bladder, cystoscopy, or a cystogram could better evaluate the bladder.     Electronically Signed   By: Dorise Bullion III M.D.   On: 07/10/2022 08:00  Both a renal ultrasound and MRI were personally reviewed by me.  Agree with  radiologic interpretation.  Assessment & Plan:    Elevated PSA - Recommend repeat fusion biopsy to investigate the rising PSA levels and unchanged lesion status. Discussed conducting the biopsy in March at the [facility] location. He will talk with his wife and call us if he decides to proceed with this. We will repeat a PSA in 6 months if he elects not to pursue this.  I expressed my concern given his PSA velocity and overall trend.  2. Microscopic hematuria -Continue to strongly recommended cystoscopy to investigate potential causes, including the possibility of bladder cancer. He expressed hesitance but is considering the procedure. -Renal ultrasound does show nodularity of the bladder wall which may be secondary to decompression.  I expressed my concern that this could represent underlying tumor.  We discussed the procedure itself, risk and benefits again today.  I strongly urged him to pursue this.  He would like to go home and discuss both of the above with his wife.  He like to also pray about it.  He will let us know if he elects to pursue either of these 2 diagnostic interventions which could be done on the same day.  At minimum, have recommended repeat PSA/UA in 6 months  I have reviewed the above documentation for accuracy and completeness, and I agree with the above.   Hollice Espy, MD   I spent 20 total minutes on the day of the encounter including pre-visit review of the medical record, face-to-face time with the patient, and post visit ordering of labs/imaging/tests.  An additional 11 minutes was spent reviewing his MRI personally, preparing the chart as well as documentation.  Return in about 6 months (around 02/11/2023) for PSA and UA.   [facility] [address], [place], [place] [address] [phone]

## 2023-02-04 ENCOUNTER — Other Ambulatory Visit: Payer: Self-pay

## 2023-02-04 DIAGNOSIS — R972 Elevated prostate specific antigen [PSA]: Secondary | ICD-10-CM

## 2023-02-04 DIAGNOSIS — R3129 Other microscopic hematuria: Secondary | ICD-10-CM

## 2023-02-04 LAB — URINALYSIS, COMPLETE
Bilirubin, UA: NEGATIVE
Ketones, UA: NEGATIVE
Leukocytes,UA: NEGATIVE
Nitrite, UA: NEGATIVE
Specific Gravity, UA: >1.03 — ABNORMAL HIGH (ref 1.005–1.030)
Urobilinogen, Ur: 0.2 mg/dL (ref 0.2–1.0)
pH, UA: 5.5 (ref 5.0–7.5)

## 2023-02-04 LAB — MICROSCOPIC EXAMINATION

## 2023-02-10 ENCOUNTER — Ambulatory Visit: Payer: Self-pay | Admitting: Urology

## 2023-02-11 ENCOUNTER — Ambulatory Visit (INDEPENDENT_AMBULATORY_CARE_PROVIDER_SITE_OTHER): Payer: Self-pay | Admitting: Urology

## 2023-02-11 VITALS — BP 142/88 | HR 64 | Ht 72.0 in | Wt 226.2 lb

## 2023-02-11 DIAGNOSIS — R972 Elevated prostate specific antigen [PSA]: Secondary | ICD-10-CM

## 2023-02-11 DIAGNOSIS — R3129 Other microscopic hematuria: Secondary | ICD-10-CM

## 2023-02-11 MED ORDER — DIAZEPAM 10 MG PO TABS: 10.0000 mg | ORAL_TABLET | Freq: Once | ORAL | 0 refills | Status: AC

## 2023-02-11 NOTE — Patient Instructions (Signed)
Prostate Biopsy Instructions  Stop all aspirin or blood thinners (aspirin, plavix, coumadin, warfarin, motrin, ibuprofen, advil, aleve, naproxen, naprosyn) for 7 days prior to the procedure.  If you have any questions about stopping these medications, please contact your primary care physician or cardiologist.  Having a light meal prior to the procedure is recommended.  If you are diabetic or have low blood sugar please bring a small snack or glucose tablet.  A Fleets enema is needed to be purchased over the counter at a local pharmacy and used 2 hours before you scheduled appointment.  This can be purchased over the counter at any pharmacy.  Antibiotics will be administered in the clinic at the time of the procedure unless otherwise specified.    Please bring someone with you to the procedure to drive you home.  A follow up appointment has been scheduled for you to receive the results of the biopsy.  If you have any questions or concerns, please feel free to call the office at [phone] or send a Mychart message.    Thank you, Staff at [facility]   Transrectal Prostate Biopsy Patient Education and Post Procedure Instructions       -Definition A prostate biopsy is the removal of a small amount of tissue from the prostate gland. The tissue is examined to determine whether there is cancer.   -Reasons for Procedure A prostate biopsy is usually done after an abnormal finding by: Digital rectal exam Prostate specific antigen (PSA) blood test A prostate biopsy is the only way to find out if cancer cells are present.   -Possible Complications Problems from the procedure are rare, but all procedures have some risk including: Infection Bruising or lengthy bleeding from the rectum, or in urine or semen Difficulty urinating Reactions to anesthesia Factors that may increase the risk of complications include: Smoking History of bleeding disorders or easy bruising Use of  any medications, over-the-counter medications, or herbal supplements Sensitivity or allergy to latex, medications, or anesthesia.   -Prior to Procedure Talk to your doctor about your medications. Blood thinning medications including aspirin should be stopped 1 week prior to procedure. If prescribed by your cardiologist we may need approval before stopping medications. Use a Fleets enema 2 hours before the procedure. Can be purchased at your pharmacy. Antibiotics will be administered in the clinic prior to procedure.  Please make sure you eat a light meal prior to coming in for your appointment. This can help prevent lightheadedness during the procedure and upset stomach from antibiotics. Please bring someone with you to the procedure to drive you home.   -Anesthesia Transrectal biopsy: Local anesthesia--Just the area that is being operated on is numbed using an injectable anesthetic.   -Description of the Procedure Transrectal biopsy--Your doctor will insert a small ultrasound device into the rectum. This device will produce sound waves to create an image of the prostate. These images will help guide placement of the needle. Your doctor will then insert the needle through the wall of the rectum and into the prostate gland. The procedure should take approximately 15-30 minutes.   -Will It Hurt? You may have discomfort and soreness at the biopsy site. Pain and discomfort after the procedure can be managed with medications.   -Postoperative Care When you return home after the procedure, do the following to help ensure a smooth recovery: Stay hydrated. Drink plenty of fluids for the next few days. Avoid difficult physical activity the day and evening of the  procedure. Keep in mind that you may see blood in your urine, stool, or semen for several days. Resume any medications that were stopped when you are advised to do so.   After the sample is taken, it will be sent to a pathologist for  examination under a microscope. This doctor will analyze the sample for cancer. You will be scheduled for an appointment to discuss results. If cancer is present, your doctor will work with you to develop a treatment plan.     -Call Your Doctor or Seek Immediate Medical Attention It is important to monitor your recovery. Alert your doctor to any problems. If any of the following occur, call your doctor or go to the emergency room: Fever 100.5 or greater within 1 week post procedure go directly to ER Call the office for: Blood in the urine more than 1 week or in semen for more than 6 weeks post-biopsy Pain that you cannot control with the medications you have been given Pain, burning, urgency, or frequency of urination Cough, shortness of breath, or chest pain- if severe go to ER Heavy rectal bleeding or bleeding that lasts more than 1 week after the biopsy If you have any questions or concerns please contact our office at [facility]   [facility] [address] [place], [place] [address] [phone]

## 2023-02-11 NOTE — Progress Notes (Signed)
Wesley Fields,acting as a scribe for Wesley Scotland, MD.,have documented all relevant documentation on the behalf of Wesley Scotland, MD,as directed by  Wesley Scotland, MD while in the presence of Wesley Scotland, MD.  02/11/2023 12:40 PM   [name] [birth year] [id number]  Referring provider: Lynnea Ferrier, MD [address] [facility] [place],  [place] [address]  Chief Complaint  Patient presents with   Elevated PSA   Hematuria    HPI: 62 year-old male with a personal history of rising PSA, concerning for prostate cancer who returns to me for a 6 month follow up.   He also has a personal history of microscopic hematuria on an isolated occasion. It was recommended that he undergo a cystoscopy but this was ultimately declined.  His urinalysis today was negative.   A renal ultrasound also showed a nodular irregularity but was not diagnostic.   In the interim, his PSA continues to rise. It started at 5.0 back in 2019. It continues to rise on each subsequent check. His most recent PSA on 8/7/204 was 13.6.   He also has an abnormal rectal exam and a PI-RADS 4 lesion at the right apical transition zone from prostate MRI in 06/2022. At his last visit in 07/2022, I strongly urge him to pursue fusion biopsy. He wanted to go home and discuss it with this wife. Ultimately, he did not pursue this. He has also had the same discussion with his primary care.  He has  undergone biopsies in the past including prostate biopsy 2018 which showed inflammation.  TRUS following at the time was 46.  He also had a fusion biopsy in 2021 which was negative for malignancy with a small focus of atypical glands at the left and right apex.    Prostate Specific Ag, Serum  Latest Ref Rng 0.0 - 4.0 ng/mL  05/24/2018 5.5 (H)   12/02/2018 5.7 (H)   07/25/2019 7.3 (H)   10/25/2019 7.2 (H)   01/23/2020 8.2 (H)   10/26/2020 9.1 (H)   05/28/2021 8.3 (H)   12/03/2021 9.9 (H)   06/02/2022 11.6 (H)    02/04/2023 13.6 (H)     Legend: (H) High  PMH: Past Medical History:  Diagnosis Date   GERD (gastroesophageal reflux disease)    Hypertension     Surgical History: Past Surgical History:  Procedure Laterality Date   CLUB FOOT RELEASE     GALLBLADDER SURGERY      Home Medications:  Allergies as of 02/11/2023       Reactions   Prednisone Other (See Comments)   Tetracycline Other (See Comments)        Medication List        Accurate as of February 11, 2023 12:40 PM. If you have any questions, ask your nurse or doctor.          hydrochlorothiazide 25 MG tablet Commonly known as: HYDRODIURIL Take 25 mg by mouth daily.   lisinopril 10 MG tablet Commonly known as: ZESTRIL Take 10 mg by mouth daily.   metFORMIN 500 MG 24 hr tablet Commonly known as: GLUCOPHAGE-XR Take 500 mg by mouth daily with breakfast.   pantoprazole 40 MG tablet Commonly known as: PROTONIX Take 40 mg by mouth daily.        Allergies:  Allergies  Allergen Reactions   Prednisone Other (See Comments)   Tetracycline Other (See Comments)    Family History: Family History  Problem Relation Age of Onset  Prostate cancer Neg Hx    Bladder Cancer Neg Hx    Kidney cancer Neg Hx     Social History:  reports that he has never smoked. He has never used smokeless tobacco. He reports that he does not drink alcohol and does not use drugs.   Physical Exam: BP (!) 142/88   Pulse 64   Ht 6' (1.829 m)   Wt 226 lb 4 oz (102.6 kg)   BMI 30.69 kg/m   Constitutional:  Alert and oriented, No acute distress. HEENT: [place] AT, moist mucus membranes.  Trachea midline, no masses. Neurologic: Grossly intact, no focal deficits, moving all 4 extremities. Psychiatric: Normal mood and affect.  Pertinent Imaging:EXAM: RENAL / URINARY TRACT ULTRASOUND COMPLETE   COMPARISON:  None Available.   FINDINGS: Right Kidney:   Renal measurements: 12.1 x 7.2 x 7.5 cm = volume: 340 mL. Echogenicity within  normal limits. No mass or hydronephrosis visualized.   Left Kidney:   Renal measurements: 13.4 x 7.4 x 7.4 cm = volume: 397 mL. There is a 17 mm cyst in the upper pole of the left kidney. No follow-up imaging recommended for the cyst.   Bladder:   The bladder is only partially filled. The bladder wall demonstrates nodular irregularity with no dominant discrete mass.   Other:   None.   IMPRESSION: 1. The kidneys are normal in appearance. 2. The bladder is only partially filled limiting evaluation. The bladder wall is nodularly irregular without a discrete dominant mass. It would be difficult to exclude a mass in the bladder on provided images. Repeat imaging with better filling of the bladder, cystoscopy, or a cystogram could better evaluate the bladder.     Electronically Signed   By: Gerome Sam III M.D.   On: 07/10/2022 08:00  This was personally reviewed and I agree with the radiologic interpretation.    Assessment & Plan:    1. Elevated PSA - Discussed the importance of pursuing a fusion biopsy to confirm the diagnosis and determine the aggressiveness of the potential cancer. - Explained the procedure, including the use of MRI and ultrasound to guide the biopsy. - He expressed understanding and agreed to schedule the biopsy either this month or next month. - Discussed the possibility of premedication with Valium for anxiety management during the procedure. - Advised him to contact the office manager for cost estimation and to reset MyChart password for efficient communication. - Handout was provided for information about fusion biopsy procedure  2. Microscopic hematuria - No recent episodes of hematuria. - Discussed the option of performing cystoscopy at the same time as the biopsy, but he preferred to focus on the biopsy first  Return for fusion biopsy.  I have reviewed the above documentation for accuracy and completeness, and I agree with the above.    Wesley Scotland, MD    [facility] [address] [place], [place] [address] [phone]

## 2023-02-20 ENCOUNTER — Telehealth: Payer: Self-pay | Admitting: Urology

## 2023-02-20 NOTE — Telephone Encounter (Signed)
Patient sent this message through mychart today: "Per results of my last lab work and consult with Dr. Apolinar Junes, please schedule me for a prostate biopsy in September. Thank you.

## 2023-02-23 NOTE — Telephone Encounter (Signed)
Spoke with patient and advised that we are waiting on dates for September that fusion biopsy can be done on and that I will be back in touch once I know that information. Patient was given instructions on his AVS about the biopsy and went over this again on the phone today. Patient verbalized understanding.

## 2023-02-24 NOTE — Telephone Encounter (Signed)
Patient advised and scheduled.  

## 2023-03-12 ENCOUNTER — Encounter: Payer: Self-pay | Admitting: Urology

## 2023-03-12 ENCOUNTER — Ambulatory Visit (INDEPENDENT_AMBULATORY_CARE_PROVIDER_SITE_OTHER): Payer: Self-pay | Admitting: Urology

## 2023-03-12 VITALS — BP 175/92 | HR 79 | Ht 72.0 in | Wt 225.0 lb

## 2023-03-12 DIAGNOSIS — Z2989 Encounter for other specified prophylactic measures: Secondary | ICD-10-CM

## 2023-03-12 DIAGNOSIS — C61 Malignant neoplasm of prostate: Secondary | ICD-10-CM

## 2023-03-12 DIAGNOSIS — R972 Elevated prostate specific antigen [PSA]: Secondary | ICD-10-CM

## 2023-03-12 MED ORDER — GENTAMICIN SULFATE 40 MG/ML IJ SOLN
80.0000 mg | Freq: Once | INTRAMUSCULAR | Status: AC
  Administered 2023-03-12: 80 mg via INTRAMUSCULAR

## 2023-03-12 MED ORDER — LEVOFLOXACIN 500 MG PO TABS
500.0000 mg | ORAL_TABLET | Freq: Once | ORAL | Status: AC
  Administered 2023-03-12: 500 mg via ORAL

## 2023-03-12 NOTE — Patient Instructions (Signed)

## 2023-03-12 NOTE — Progress Notes (Signed)
   03/12/23  Indication: 62 yo M with rising PSA to 13.6 with Pi-RAD4 lesion.  MRI Fusion Prostate Biopsy Procedure   Informed consent was obtained, and we discussed the risks of bleeding and infection/sepsis. A time out was performed to ensure correct patient identity.  Pre-Procedure: - Last PSA Level: 13.6 - Gentamicin and levaquin given for antibiotic prophylaxis -Prostate measured 49.4 g on MRI, PSA density 0.29 - No significant hypoechoic or median lobe noted  Procedure: - Prostate block performed using 10 cc 1% lidocaine  - MRI fusion biopsy was performed, and 3 biopsies were taken from the ROI PI-RADS 4 lesion at the right apical transition zone from prostate MRI in 06/2022  - Standard biopsies taken from sextant areas, 12 under ultrasound guidance. - Total of 15 cores taken  Post-Procedure: - Patient tolerated the procedure well - He was counseled to seek immediate medical attention if experiences significant bleeding, fevers, or severe pain - Return in one week to discuss biopsy results  Assessment/ Plan: Will follow up in 1-2 weeks to discuss pathology with Dr. Silvio Pate, MD

## 2023-03-18 ENCOUNTER — Ambulatory Visit (INDEPENDENT_AMBULATORY_CARE_PROVIDER_SITE_OTHER): Payer: Self-pay | Admitting: Urology

## 2023-03-18 VITALS — BP 163/97 | HR 68

## 2023-03-18 DIAGNOSIS — C61 Malignant neoplasm of prostate: Secondary | ICD-10-CM

## 2023-03-18 DIAGNOSIS — R972 Elevated prostate specific antigen [PSA]: Secondary | ICD-10-CM

## 2023-03-18 DIAGNOSIS — Z87898 Personal history of other specified conditions: Secondary | ICD-10-CM

## 2023-03-18 NOTE — Progress Notes (Signed)
[name],acting as a scribe for [name], MD.,have documented all relevant documentation on the behalf of [name], MD,as directed by  [name], MD while in the presence of [name], MD.  03/18/2023 6:25 PM   Wesley Fields 1961-05-10 [id number]  Referring provider: Lynnea Ferrier, MD [address] [facility] [place],  [place] [address]  Chief Complaint  Patient presents with   Results    HPI:  62 year-old male with a personal history of elevated PSA who returns today to review prostate biopsy results. Notably, he has a long history of elevated rising PSAs. He has had multiple negative biopsies in the past, some of which showed some atypical glands, but never were diagnostic.  His most recent PSA on 8/7/52024 was 13.6.   He underwent an MRI of the prostate on 07/09/2022 that showed no changes in a PI-RADS 4 lesion. He underwent fusion biopsy on 03-12-2023 shows multiple cores of Gleason 3+3 bilaterally at the apices, as well as a region of interest up to 15%.    He reports minimal bleeding post-biopsy and was able to resume light activities, including playing golf, the next day.     PMH: Past Medical History:  Diagnosis Date   GERD (gastroesophageal reflux disease)    Hypertension     Surgical History: Past Surgical History:  Procedure Laterality Date   CLUB FOOT RELEASE     GALLBLADDER SURGERY      Home Medications:  Allergies as of 03/18/2023       Reactions   Prednisone Other (See Comments)   Tetracycline Other (See Comments)        Medication List        Accurate as of March 18, 2023  6:25 PM. If you have any questions, ask your nurse or doctor.          hydrochlorothiazide 25 MG tablet Commonly known as: HYDRODIURIL Take 25 mg by mouth daily.   lisinopril 10 MG tablet Commonly known as: ZESTRIL Take 10 mg by mouth daily.   metFORMIN 500 MG 24 hr tablet Commonly known as:  GLUCOPHAGE-XR Take 500 mg by mouth daily with breakfast.   pantoprazole 40 MG tablet Commonly known as: PROTONIX Take 40 mg by mouth daily.        Allergies:  Allergies  Allergen Reactions   Prednisone Other (See Comments)   Tetracycline Other (See Comments)    Family History: Family History  Problem Relation Age of Onset   Prostate cancer Neg Hx    Bladder Cancer Neg Hx    Kidney cancer Neg Hx     Social History:  reports that he has never smoked. He has never used smokeless tobacco. He reports that he does not drink alcohol and does not use drugs.   Physical Exam: BP (!) 163/97   Pulse 68   Constitutional:  Alert and oriented, No acute distress. HEENT: [place] AT, moist mucus membranes.  Trachea midline, no masses. Neurologic: Grossly intact, no focal deficits, moving all 4 extremities. Psychiatric: Normal mood and affect.   Assessment & Plan:    1. Prostate cancer - Very low-risk - The patient was counseled about the natural history of prostate cancer and the standard treatment options that are available for prostate cancer. It was explained to him how his age and life expectancy, clinical stage, Gleason score, and PSA affect his prognosis, the decision to proceed with additional staging studies, as well as how that  information influences recommended treatment strategies. We discussed the roles for active surveillance, radiation therapy, surgical therapy, androgen deprivation, as well as ablative therapy options for the treatment of prostate cancer as appropriate to his individual cancer situation. We discussed the risks and benefits of these options with regard to their impact on cancer control and also in terms of potential adverse events, complications, and impact on quality of life particularly related to urinary, bowel, and sexual function. The patient was encouraged to ask questions throughout the discussion today and all questions were answered to his stated  satisfaction. In addition, the patient was provided with and/or directed to appropriate resources and literature for further education about prostate cancer treatment options. - No treatment recommended at this time due to low-risk nature.  - Plan to monitor PSA levels every six months.  - Consider repeat MRI or biopsy if PSA levels rise significantly.  - Educated him on the slow-growing nature of this cancer and its typical non-aggressive behavior.    Return in 6 months (on 09/15/2023) for lab visit with PSA. Office visit with PSA, DRE in 1 year.   [facility] [address] [place], [place] [address] [phone]

## 2023-09-15 ENCOUNTER — Other Ambulatory Visit: Payer: Self-pay

## 2023-09-30 ENCOUNTER — Other Ambulatory Visit: Payer: Self-pay

## 2023-10-14 ENCOUNTER — Other Ambulatory Visit: Payer: Self-pay

## 2023-10-14 DIAGNOSIS — Z87898 Personal history of other specified conditions: Secondary | ICD-10-CM

## 2023-10-14 DIAGNOSIS — R972 Elevated prostate specific antigen [PSA]: Secondary | ICD-10-CM

## 2023-10-15 ENCOUNTER — Encounter: Payer: Self-pay | Admitting: Urology

## 2023-10-15 ENCOUNTER — Other Ambulatory Visit: Payer: Self-pay

## 2023-10-15 DIAGNOSIS — R3129 Other microscopic hematuria: Secondary | ICD-10-CM

## 2023-10-15 DIAGNOSIS — R972 Elevated prostate specific antigen [PSA]: Secondary | ICD-10-CM

## 2023-10-15 DIAGNOSIS — Z87898 Personal history of other specified conditions: Secondary | ICD-10-CM

## 2023-10-15 LAB — PSA: Prostate Specific Ag, Serum: 15.3 ng/mL — ABNORMAL HIGH (ref 0.0–4.0)

## 2023-10-15 NOTE — Progress Notes (Signed)
 MRI ordered

## 2024-02-11 ENCOUNTER — Encounter: Payer: Self-pay | Admitting: Urology

## 2024-03-14 ENCOUNTER — Other Ambulatory Visit: Payer: Self-pay

## 2024-03-14 DIAGNOSIS — R972 Elevated prostate specific antigen [PSA]: Secondary | ICD-10-CM

## 2024-03-16 ENCOUNTER — Ambulatory Visit: Payer: Self-pay | Admitting: Urology

## 2024-03-17 ENCOUNTER — Ambulatory Visit: Payer: Self-pay | Admitting: Urology

## 2024-03-25 ENCOUNTER — Other Ambulatory Visit: Payer: Self-pay | Admitting: Internal Medicine

## 2024-03-25 DIAGNOSIS — N179 Acute kidney failure, unspecified: Secondary | ICD-10-CM

## 2024-04-06 ENCOUNTER — Ambulatory Visit
Admission: RE | Admit: 2024-04-06 | Discharge: 2024-04-06 | Disposition: A | Discharge status: Home / Self Care | Payer: Self-pay | Source: Ambulatory Visit | Attending: Internal Medicine | Admitting: Internal Medicine

## 2024-04-06 DIAGNOSIS — N179 Acute kidney failure, unspecified: Secondary | ICD-10-CM | POA: Insufficient documentation

## 2024-04-27 ENCOUNTER — Other Ambulatory Visit: Payer: Self-pay

## 2024-04-27 ENCOUNTER — Ambulatory Visit: Payer: Self-pay | Admitting: Urology

## 2024-04-27 DIAGNOSIS — R972 Elevated prostate specific antigen [PSA]: Secondary | ICD-10-CM

## 2024-04-28 LAB — PSA: Prostate Specific Ag, Serum: 22.6 ng/mL — ABNORMAL HIGH (ref 0.0–4.0)

## 2024-05-04 ENCOUNTER — Telehealth: Payer: Self-pay

## 2024-05-04 ENCOUNTER — Ambulatory Visit: Payer: Self-pay | Admitting: Urology

## 2024-05-04 ENCOUNTER — Other Ambulatory Visit: Payer: Self-pay

## 2024-05-04 VITALS — BP 144/81 | HR 74 | Ht 71.0 in | Wt 220.0 lb

## 2024-05-04 DIAGNOSIS — N401 Enlarged prostate with lower urinary tract symptoms: Secondary | ICD-10-CM

## 2024-05-04 DIAGNOSIS — C61 Malignant neoplasm of prostate: Secondary | ICD-10-CM

## 2024-05-04 DIAGNOSIS — N138 Other obstructive and reflux uropathy: Secondary | ICD-10-CM

## 2024-05-04 DIAGNOSIS — N1330 Unspecified hydronephrosis: Secondary | ICD-10-CM

## 2024-05-04 DIAGNOSIS — N179 Acute kidney failure, unspecified: Secondary | ICD-10-CM

## 2024-05-04 NOTE — Progress Notes (Signed)
   [facility] Surgical Posting Form  Surgery Date: Date: 06/06/2024  Surgeon: Dr. 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

## 2024-05-04 NOTE — Patient Instructions (Addendum)
 Please call [phone] or [phone] to schedule your imaging prior to your appointment.      Holmium Laser Enucleation of the Prostate (HoLEP)  HoLEP is a treatment for men with benign prostatic hyperplasia (BPH). The laser surgery removed blockages of urine flow, and is done without any incisions on the body.     What is HoLEP?  HoLEP is a type of laser surgery used to treat obstruction (blockage) of urine flow as a result of benign prostatic hyperplasia (BPH). In men with BPH, the prostate gland is not cancerous, but has become enlarged. An enlarged prostate can result in a number of urinary tract symptoms such as weak urinary stream, difficulty in starting urination, inability to urinate, frequent urination, or getting up at night to urinate.  HoLEP was developed in the 1990's as a more effective and less expensive surgical option for BPH, compared to other surgical options such as laser vaporization(PVP/greenlight laser), transurethral resection of the prostate(TURP), and open simple prostatectomy.   What happens during a HoLEP?  HoLEP requires general anesthesia ("asleep" throughout the procedure).   An antibiotic is given to reduce the risk of infection  A surgical instrument called a resectoscope is inserted through the urethra (the tube that carries urine from the bladder). The resectoscope has a camera that allows the surgeon to view the internal structure of the prostate gland, and to see where the incisions are being made during surgery.  The laser is inserted into the resectoscope and is used to enucleate (free up) the enlarged prostate tissue from the capsule (outer shell) and then to seal up any blood vessels. The tissue that has been removed is pushed back into the bladder.  A morcellator is placed through the resectoscope, and is used to suction out the prostate tissue that has been pushed into the bladder.  When the prostate tissue has been removed, the resectoscope  is removed, and a foley catheter is placed to allow healing and drain the urine from the bladder.     What happens after a HoLEP?  More than 95% of patients go home the same day a few hours after surgery. Less than 5% will be admitted to the hospital overnight for observation to monitor the urine, or if they have other medical problems.  Fluid is flushed through the catheter for about 1 hour after surgery to clear any blood from the urine. It is normal to have some blood in the urine after surgery. The need for blood transfusion is extremely rare.  Eating and drinking are permitted after the procedure once the patient has fully awakened from anesthesia.  The catheter is usually removed 2-3 days after surgery- the patient will come to clinic to have the catheter removed and make sure they can urinate on their own.  It is very important to drink lots of fluids after surgery for one week to keep the bladder flushed.  At first, there may be some burning with urination, but this typically improved within a few hours to days. Most patients do not have a significant amount of pain, and narcotic pain medications are rarely needed.  Symptoms of urinary frequency, urgency, and even leakage are NORMAL for the first few weeks after surgery as the bladder adjusts after having to work hard against blockage from the prostate for many years. This will improve, but can sometimes take several months.  The use of pelvic floor exercises (Kegel exercises) can help improve problems with urinary incontinence.   After catheter removal,  patients will be seen at 12 weeks and 6 months for symptom check  No heavy lifting for at least 2-3 weeks after surgery, however patients can walk and do light activities the first day after surgery. Return to work time depends on occupation.    What are the advantages of HoLEP?  HoLEP has been studied in many different parts of the world and has been shown to be a safe and  effective procedure. Although there are many types of BPH surgeries available, HoLEP offers a unique advantage in being able to remove a large amount of tissue without any incisions on the body, even in very large prostates, while decreasing the risk of bleeding and providing tissue for pathology (to look for cancer). This decreases the need for blood transfusions during surgery, minimizes hospital stay, and reduces the risk of needing repeat treatment.  What are the side effects of HoLEP?  Temporary burning and bleeding during urination. Some blood may be seen in the urine for weeks after surgery and is part of the healing process.  Urinary incontinence (inability to control urine flow) is expected in all patients immediately after surgery and they should wear pads for the first few days/weeks. This typically improves over the course of several weeks. Performing Kegel exercises can help decrease leakage from stress maneuvers such as coughing, sneezing, or lifting. The rate of long term leakage is very low, 1-2%. Patients may also have leakage with urgency and this may be treated with medication. The risk of urge incontinence can be dependent on several factors including age, prostate size, symptoms, and other medical problems.  Retrograde ejaculation or "backwards ejaculation." In 80% of cases, the patient will not see any fluid during ejaculation after surgery.  Erectile function is generally not significantly affected.   What are the risks of HoLEP?  Injury to the urethra or development of scar tissue at a later date  Injury to the capsule of the prostate (typically treated with longer catheterization).  Injury to the bladder or ureteral orifices (where the urine from the kidney drains out)  Infection of the bladder, testes, or kidneys (~4%)  Return of urinary obstruction at a later date requiring another operation (<2%)  Need for blood transfusion or re-operation due to bleeding   Failure to relieve all symptoms and/or need for prolonged catheterization after surgery  5-15% of patients are found to have previously undiagnosed prostate cancer in their specimen. Prostate cancer can be treated after HoLEP.  Standard risks of anesthesia including blood clots, heart attacks, etc ~1-2% risk of long term urinary incontinence (leakage)  When should I call my doctor?  Fever over 101.3 degrees  Inability to urinate, or large blood clots in the urine   -----------------------------------------------------------  Clean Intermittent Catheterization, Male  Clean intermittent catheterization (CIC) is a procedure to drain pee (urine) from the bladder by placing a soft tube (catheter) into the bladder though the urethra. The urethra is a tube in the body that carries pee from the bladder out of the body. CIC reduces the risk of infection and other problems that may arise when pee is not completely emptied from the bladder. CIC may be done when: You cannot completely empty your bladder on your own. This may be due to a blockage in the bladder or urethra. Your bladder leaks pee. This may happen when the muscles or nerves near the bladder are not working normally, so the bladder overflows. Your health care provider will show you how to do the  procedure. You will also be given the supplies you need to do the procedure. Supplies needed: Germ-free (sterile), water-based lubricant. A container for pee collection. You may also use the toilet to dispose of pee from the catheter. A catheter. Your provider will determine the best size for you. Use this catheter size: ______________________________ Clean gloves. Soap and water. Clean washcloth and towel. How to perform this procedure: Most people need CIC at least 4 times per day to completely empty the bladder. Your provider will tell you how often you should do CIC. Number of times per day to perform CIC:  ______________________________________________________________________ To perform CIC, follow these steps: Wash your hands with soap and water for 20 seconds. If soap and water are not available, use hand sanitizer. Clean your penis with soap and water. Dry the tip of your penis completely. Prepare the supplies that you will use during the procedure. Open the catheter package and lubricant. Get in a comfortable position. Possible positions include: Sitting on a toilet, a chair, or the edge of a bed. Standing near a toilet. Lying down with your head raised on pillows and your knees pointing to the ceiling. You may wish to place a waterproof mat or pad under you. If you are using a pee collection container, position it between your legs. Pee, if you are able. Put on gloves. Apply lubricant to about 2 inches (5 cm) of the tip of the catheter. Set the catheter down on a clean, dry surface within reach. Gently stretch your penis out from your body. Pull back any skin that covers the end of your penis (foreskin). Clean the end of your penis with sterile swabs as told by your provider. Hold your penis upward at a 45-60 degree angle. This helps to straighten the urethra. Slowly insert the lubricated catheter straight into your urethra until pee flows freely. This is usually about 6-8 inches (15-20 cm). When pee starts to flow freely, insert the catheter 1 inch (3 cm) more. Allow pee to drain into the toilet or the pee collection container. When pee stops flowing, slowly remove the catheter. Note the color, amount, and odor of the pee. Measure your pee and note the amount, if told by your provider. Discard the pee in the toilet. Clean your penis using soap and water. Move the foreskin back in place, if applicable. If you are using a single-use catheter, discard the catheter and supplies. Wash your hands with soap and water. If you are using a reusable catheter, follow package instructions about how  to clean the catheter after each use. How often should I do this procedure? Do CIC to empty your bladder every 4-6 hours or as often as told by your provider. If you have symptoms of too much pee in your bladder (overdistension) and you are not able to pee, perform CIC. Symptoms of overdistension may include: Restlessness. Sweating or chills. Headache. Flushed or pale skin. Bloated lower abdomen. What are the risks? The provider will talk with you about the risks. These may include: Infection. Injury to the urethra. Irritation of the urethra. Follow these instructions at home General instructions Drink enough fluid to keep your pee pale yellow. Throw away a catheter when it becomes dry, brittle, or cloudy. This usually happens after you use the catheter for 1 week. Avoid caffeine. Caffeine may make you need to pee more frequently and more urgently. When traveling, bring extra supplies with you in case of delays. Keep supplies with you in a place that  you can access easily. If traveling by plane: Make sure that the lubricant in your carry-on bag is less than 3.4 ounces (100 mL). Use a single-use catheter. It may be difficult to clean a reusable catheter in a small bathroom. Take over-the-counter and prescription medicines only as told by your provider. Contact a health care provider if: You have difficulty doing CIC. You have pee leaking around the catheter during CIC. You have: Dark or cloudy pee. Blood in your pee or in your catheter. A change in the smell of your pee or discharge. A burning feeling while you pee. You vomit or feel like you may vomit. You have pain in your abdomen, your back, or your sides below your ribs. You have swelling or redness around the opening of your urethra. You develop a rash or sores on your skin. Get help right away if: You have a fever. You have symptoms that do not go away after 3 days. You have symptoms that suddenly get worse. You have  severe pain. You start passing a little pee, or a little pee drains from your bladder. This information is not intended to replace advice given to you by your health care provider. Make sure you discuss any questions you have with your health care provider. Document Revised: 02/10/2022 Document Reviewed: 02/10/2022 Elsevier Patient Education  2024 [employer].

## 2024-05-04 NOTE — Progress Notes (Signed)
 05/04/2024 11:41 AM   Wesley Fields [birth year] [id number]  Reason for visit: Follow up prostate cancer, incomplete bladder emptying and new hydronephrosis, acute kidney injury  History: Previously followed by Dr. Penne, my initial visit with him November 2025 after she left the practice Long history of elevated PSA and negative biopsies, MRI prostate January 2024 showed a stable PI-RADS 4 lesion and fusion biopsy showed grade group 1 disease, low-volume in the ROI and 2/12 cores.  He opted for active surveillance  Physical Exam: BP (!) 144/81 (BP Location: Left Arm, Patient Position: Sitting, Cuff Size: Normal)   Pulse 74   Ht 5' 11 (1.803 m)   Wt 220 lb (99.8 kg)   SpO2 97%   BMI 30.68 kg/m   Imaging/labs: Most recent PSA October 2025 22.6 from 15 in April 2025 Worsening renal function over the last few months, most recent creatinine 2.3, eGFR 31 I personally viewed and interpreted the most recent renal ultrasound from October 2025 which shows new bladder distention and bilateral hydronephrosis even after voiding which is new from ultrasound in January 2024 Prostate MRI January 2024 with 50 g prostate, stable from prior MRI 2021 with small PI-RADS 4 lesion right apex transition zone  Today: Worsening urinary symptoms with overflow incontinence overnight requiring a pad despite Flomax  Understandably concerned about the increase in PSA, worsening urinary symptoms, and worsening renal function He is adamantly opposed to Foley catheter placement  Plan:   Prostate cancer: Has been low risk, but significant jump in PSA, though most likely related to new retention.  I recommended a repeat prostate MRI to confirm no significant changes from prior MRI January 2024.  If no significant changes can defer repeat biopsy BPH with obstruction/hydronephrosis/AKI: Likely related to BPH, not prostate cancer.  If no worrisome change on MRI, would recommend pursuing HOLEP.  Risks and benefits  reviewed extensively.  We discussed the difference between radical prostatectomy for prostate cancer in HOLEP, as well as differences in recovery time.  We reviewed potential need for additional treatment or procedure pending pathology results from HOLEP.  We discussed the risks and benefits of HoLEP at length.  The procedure requires general anesthesia and takes 1 to 2 hours, and a holmium laser is used to enucleate the prostate and push this tissue into the bladder.  A morcellator is then used to remove this tissue, which is sent for pathology.  The vast majority(>95%) of patients are able to discharge the same day with a catheter in place for 2 to 3 days, and will follow-up in clinic for a voiding trial.  We specifically discussed the risks of bleeding, infection, retrograde ejaculation, temporary urgency and urge incontinence, very low risk of long-term incontinence, urethral stricture/bladder neck contracture, pathologic evaluation of prostate tissue and possible detection of prostate cancer or other malignancy, and possible need for additional procedures. Short-term we discussed options including Foley catheter placement or intermittent catheterization.  He is adamantly opposed to a Foley catheter, but was willing to return next week for intermittent catheterization teaching. Prostate MRI ordered, if no significant changes from prior MRI January 2024 will proceed with HOLEP(50g) for BPH with obstruction/incomplete emptying/bilateral hydronephrosis and AKI  I spent 50 total minutes on the day of the encounter including pre-visit review of the medical record, face-to-face time with the patient, and post visit ordering of labs/imaging/tests.  Extensive review of prior records from Dr. Penne, prostate biopsy results, imaging including prostate MRI and renal ultrasound, and treatment options with both  low risk prostate cancer and obstruction with upstream hydronephrosis and acute kidney injury.   Wesley JAYSON Burnet, MD  [facility] [address] [place], [place] [address] [phone]

## 2024-05-04 NOTE — Telephone Encounter (Signed)
 Per Dr. Francisca, Patient is to be scheduled for Holmium Laser Enucleation of the Prostate   Wesley Fields was contacted and possible surgical dates were discussed, Friday December 8th, 2025 was agreed upon for surgery.   Patient was instructed that Dr. Francisca will require them to provide a pre-op UA & CX prior to surgery. This was ordered and scheduled drop off appointment was made for 05/25/2024.    Patient was directed to call [phone] between 1-3pm the day before surgery to find out surgical arrival time.  Instructions were given not to eat or drink from midnight on the night before surgery and have a driver for the day of surgery. On the surgery day patient was instructed to enter through the [facility] entrance of [facility] report the Same Day Surgery desk.   Pre-Admit Testing will be in contact via phone to set up an interview with the anesthesia team to review your history and medications prior to surgery.   Reminder of this information was sent via Mail to the patient.

## 2024-05-04 NOTE — Progress Notes (Signed)
 Surgical Physician Order Form [facility] [place]  Dr. Redell Burnet, MD  * Scheduling expectation : Next Available (prostate MRI ordered and must be completed prior to surgery!)  *Length of Case: 1.5 hours  *Clearance needed: no  *Anticoagulation Instructions: Hold all anticoagulants  *Aspirin Instructions: Hold Aspirin  *Post-op visit Date/Instructions:  1-3 day cath removal, 53-month MD with PSA prior  *Diagnosis: BPH w/urinary obstruction  *Procedure:  HOLEP (47350)   Additional orders: N/A  -Admit type: OUTpatient  -Anesthesia: General  -VTE Prophylaxis Standing Order SCD's       Other:   -Standing Lab Orders Per Anesthesia    Lab other: UA&Urine Culture  -Standing Test orders EKG/Chest x-ray per Anesthesia       Test other:   - Medications:  Ancef 2gm IV  -Other orders:  N/A

## 2024-05-09 NOTE — Progress Notes (Unsigned)
 05/11/2024 12:39 PM   Wolm DELENA Daring [birth year] [id number]  Referring provider: Fernande Ophelia JINNY DOUGLAS, MD [address] [facility] [place],  [place] [address]  Urological history: 1. Low risk prostate cancer  - PSA (03/2024) 22.6 - repeat prostate MRI pending  2. BPH with retention - Tentatively scheduled for HoLEP pending prostate MRI results  3. Bilateral hydronephrosis - Likely secondary to retention  4. AKI - Likely secondary to retention  No chief complaint on file.  HPI: Wesley Fields is a 63 y.o. man who presents today for instruction on continuous intermittent catheterization.  Previous records reviewed.  Patient presents with ongoing urinary retention. Reports difficulty initiating voiding and incomplete bladder emptying. No current urinary tract infection symptoms. Patient is motivated to learn CIC for bladder management.  PMH: Past Medical History:  Diagnosis Date   GERD (gastroesophageal reflux disease)    Hypertension     Surgical History: Past Surgical History:  Procedure Laterality Date   CLUB FOOT RELEASE     GALLBLADDER SURGERY      Home Medications:  Allergies as of 05/11/2024       Reactions   Prednisone Other (See Comments)   Tetracycline Other (See Comments)        Medication List        Accurate as of May 09, 2024 12:39 PM. If you have any questions, ask your nurse or doctor.          amLODipine 5 MG tablet Commonly known as: NORVASC Take 5 mg by mouth daily.   esomeprazole 40 MG capsule Commonly known as: NEXIUM Take 40 mg by mouth.   hydrochlorothiazide 25 MG tablet Commonly known as: HYDRODIURIL Take 25 mg by mouth daily.   metFORMIN 500 MG 24 hr tablet Commonly known as: GLUCOPHAGE-XR Take 500 mg by mouth daily with breakfast.   tamsulosin  0.4 MG Caps capsule Commonly known as: FLOMAX  Take 0.4 mg by mouth daily.        Allergies:  Allergies  Allergen Reactions   Prednisone Other  (See Comments)   Tetracycline Other (See Comments)    Family History: Family History  Problem Relation Age of Onset   Prostate cancer Neg Hx    Bladder Cancer Neg Hx    Kidney cancer Neg Hx     Social History:  reports that he has never smoked. He has never used smokeless tobacco. He reports that he does not drink alcohol and does not use drugs.  ROS: Pertinent ROS in HPI  Physical Exam: There were no vitals taken for this visit.  Constitutional:  Well nourished. Alert and oriented, No acute distress. HEENT: [place] AT, moist mucus membranes.  Trachea midline, no masses. Cardiovascular: No clubbing, cyanosis, or edema. Respiratory: Normal respiratory effort, no increased work of breathing. GI: Abdomen is soft, non tender, non distended, no abdominal masses. Liver and spleen not palpable.  No hernias appreciated.  Stool sample for occult testing is not indicated.   GU: No CVA tenderness.  No bladder fullness or masses.  Patient with circumcised/uncircumcised phallus. ***Foreskin easily retracted***  Urethral meatus is patent.  No penile discharge. No penile lesions or rashes. Scrotum without lesions, cysts, rashes and/or edema.  Testicles are located scrotally bilaterally. No masses are appreciated in the testicles. Left and right epididymis are normal. Rectal: Patient with  normal sphincter tone. Anus and perineum without scarring or rashes. No rectal masses are appreciated. Prostate is approximately *** grams, *** nodules are appreciated. Seminal  vesicles are normal. Skin: No rashes, bruises or suspicious lesions. Lymph: No cervical or inguinal adenopathy. Neurologic: Grossly intact, no focal deficits, moving all 4 extremities. Psychiatric: Normal mood and affect.  Laboratory Data: See Epic and HPI   I have reviewed the labs.   Pertinent Imaging: N/A  Assessment: Urinary retention likely secondary to BPH .  CIC initiated as a safe and effective method for bladder  emptying.  Plan: -Patient instructed on clean intermittent catheterization technique, including hand hygiene, catheter handling, insertion, and disposal. -Demonstrated understanding and performed first catheterization under supervision. -Provided educational materials and supplies. -Will continue CIC 3-4 times daily or as needed based on bladder volumes. -Monitor for signs of UTI or complications.  No follow-ups on file.  These notes generated with voice recognition software. I apologize for typographical errors.  CLOTILDA HELON RIGGERS  [facility] [address]  [address] [place], [place] [address] [phone]

## 2024-05-11 ENCOUNTER — Ambulatory Visit
Admission: RE | Admit: 2024-05-11 | Discharge: 2024-05-11 | Disposition: A | Discharge status: Home / Self Care | Payer: Self-pay | Source: Ambulatory Visit | Attending: Urology | Admitting: Urology

## 2024-05-11 ENCOUNTER — Encounter: Payer: Self-pay | Admitting: Urology

## 2024-05-11 ENCOUNTER — Ambulatory Visit (INDEPENDENT_AMBULATORY_CARE_PROVIDER_SITE_OTHER): Payer: Self-pay | Admitting: Urology

## 2024-05-11 VITALS — BP 161/91 | HR 70 | Ht 71.0 in | Wt 220.0 lb

## 2024-05-11 DIAGNOSIS — C61 Malignant neoplasm of prostate: Secondary | ICD-10-CM | POA: Insufficient documentation

## 2024-05-11 DIAGNOSIS — N138 Other obstructive and reflux uropathy: Secondary | ICD-10-CM

## 2024-05-11 DIAGNOSIS — R339 Retention of urine, unspecified: Secondary | ICD-10-CM

## 2024-05-11 MED ORDER — GADOBUTROL 1 MMOL/ML IV SOLN
10.0000 mL | Freq: Once | INTRAVENOUS | Status: AC | PRN
  Administered 2024-05-11: 10 mL via INTRAVENOUS

## 2024-05-17 ENCOUNTER — Ambulatory Visit: Payer: Self-pay | Admitting: Urology

## 2024-05-25 ENCOUNTER — Other Ambulatory Visit: Payer: Self-pay

## 2024-05-25 DIAGNOSIS — N138 Other obstructive and reflux uropathy: Secondary | ICD-10-CM

## 2024-05-25 LAB — URINALYSIS, COMPLETE
Bilirubin, UA: NEGATIVE
Glucose, UA: NEGATIVE
Ketones, UA: NEGATIVE
Leukocytes,UA: NEGATIVE
Nitrite, UA: NEGATIVE
Protein,UA: NEGATIVE
RBC, UA: NEGATIVE
Specific Gravity, UA: 1.01 (ref 1.005–1.030)
Urobilinogen, Ur: 0.2 mg/dL (ref 0.2–1.0)
pH, UA: 6 (ref 5.0–7.5)

## 2024-05-25 LAB — MICROSCOPIC EXAMINATION
Bacteria, UA: NONE SEEN
RBC, Urine: NONE SEEN /HPF (ref 0–2)

## 2024-05-30 ENCOUNTER — Encounter
Admission: RE | Admit: 2024-05-30 | Discharge: 2024-05-30 | Disposition: A | Discharge status: Home / Self Care | Payer: Self-pay | Source: Ambulatory Visit | Attending: Urology | Admitting: Urology

## 2024-05-30 ENCOUNTER — Other Ambulatory Visit: Payer: Self-pay

## 2024-05-30 VITALS — Ht 74.0 in | Wt 214.0 lb

## 2024-05-30 DIAGNOSIS — K7460 Unspecified cirrhosis of liver: Secondary | ICD-10-CM

## 2024-05-30 DIAGNOSIS — I10 Essential (primary) hypertension: Secondary | ICD-10-CM

## 2024-05-30 DIAGNOSIS — R739 Hyperglycemia, unspecified: Secondary | ICD-10-CM

## 2024-05-30 DIAGNOSIS — E785 Hyperlipidemia, unspecified: Secondary | ICD-10-CM

## 2024-05-30 DIAGNOSIS — N1832 Chronic kidney disease, stage 3b: Secondary | ICD-10-CM

## 2024-05-30 HISTORY — DX: Chronic kidney disease, stage 3 unspecified: N18.30

## 2024-05-30 HISTORY — DX: Type 2 diabetes mellitus without complications: E11.9

## 2024-05-30 HISTORY — DX: Unspecified cirrhosis of liver: K74.60

## 2024-05-30 HISTORY — DX: Other complications of anesthesia, initial encounter: T88.59XA

## 2024-05-30 LAB — CULTURE, URINE COMPREHENSIVE

## 2024-05-30 NOTE — Patient Instructions (Addendum)
 Your procedure is scheduled on: Monday 06/06/24 Report to the Registration Desk on the 1st floor of the [facility]. To find out your arrival time, please call [phone] between 1PM - 3PM on: Friday 06/04/24 If your arrival time is 6:00 am, do not arrive before that time as the [facility] entrance doors do not open until 6:00 am.  REMEMBER: Instructions that are not followed completely may result in serious medical risk, up to and including death; or upon the discretion of your surgeon and anesthesiologist your surgery may need to be rescheduled.  Do not eat food or drink any liquids after midnight the night before surgery.  No gum chewing or hard candies.  One week prior to surgery: Stop Anti-inflammatories (NSAIDS) such as Advil, Aleve, Ibuprofen, Motrin, Naproxen, Naprosyn and Aspirin based products such as Excedrin, Goody's Powder, BC Powder.  You may however, continue to take Tylenol if needed for pain up until the day of surgery. (Unless otherwise instructed by your MD not to take this OTC pain reliever)  Stop ANY OVER THE COUNTER supplements and vitamins until after surgery.  Continue taking all of your other prescription medications up until the day of surgery.  ON THE DAY OF SURGERY ONLY TAKE THESE MEDICATIONS WITH SIPS OF WATER:  amLODipine (NORVASC) 5 MG tablet  esomeprazole (NEXIUM) 40 MG capsule  tamsulosin  (FLOMAX ) 0.4 MG CAPS capsule   No Alcohol for 24 hours before or after surgery.  No Smoking including e-cigarettes for 24 hours before surgery.  No chewable tobacco products for at least 6 hours before surgery.  No nicotine patches on the day of surgery.  Do not use any recreational drugs for at least a week (preferably 2 weeks) before your surgery.  Please be advised that the combination of cocaine and anesthesia may have negative outcomes, up to and including death. If you test positive for cocaine, your surgery will be cancelled.  On the morning of  surgery brush your teeth with toothpaste and water, you may rinse your mouth with mouthwash if you wish. Do not swallow any toothpaste or mouthwash.  Shower before arriving for surgery.  Do not wear lotions, powders, or perfumes.  Do not shave body hair from the neck down 48 hours before surgery.    Do not wear jewelry, make-up, hairpins, clips or nail polish.  For welded (permanent) jewelry: bracelets, anklets, waist bands, etc.  Please have this removed prior to surgery.  If it is not removed, there is a chance that hospital personnel will need to cut it off on the day of surgery.  Wear comfortable clothing (specific to your surgery type) to the hospital.   Contact lenses, hearing aids and dentures may not be worn into surgery.  Do not bring valuables to the hospital. [facility] is not responsible for any missing/lost belongings or valuables.   Notify your doctor if there is any change in your medical condition (cold, fever, infection).  After surgery, you can help prevent lung complications by doing breathing exercises.  Take deep breaths and cough every 1-2 hours. Your doctor may order a device called an Incentive Spirometer to help you take deep breaths.  If you are being discharged the day of surgery, you will not be allowed to drive home. You will need a responsible individual to drive you home and stay with you for 24 hours after surgery.   Please call the Pre-admissions Testing Dept. at [phone] if you have any questions about these instructions.  Surgery Visitation Policy:  Patients having surgery or a procedure may have two visitors.  Children under the age of 38 must have an adult with them who is not the patient.  Merchandiser, Retail to address health-related social needs:  [email or web address]

## 2024-06-03 ENCOUNTER — Inpatient Hospital Stay: Admission: RE | Admit: 2024-06-03 | Discharge: 2024-06-03 | Payer: Self-pay | Attending: Urology

## 2024-06-03 DIAGNOSIS — E785 Hyperlipidemia, unspecified: Secondary | ICD-10-CM

## 2024-06-03 DIAGNOSIS — K7460 Unspecified cirrhosis of liver: Secondary | ICD-10-CM

## 2024-06-03 DIAGNOSIS — I10 Essential (primary) hypertension: Secondary | ICD-10-CM

## 2024-06-03 DIAGNOSIS — R739 Hyperglycemia, unspecified: Secondary | ICD-10-CM

## 2024-06-03 LAB — CBC
HCT: 31.1 % — ABNORMAL LOW (ref 39.0–52.0)
Hemoglobin: 11.1 g/dL — ABNORMAL LOW (ref 13.0–17.0)
MCH: 29 pg (ref 26.0–34.0)
MCHC: 35.7 g/dL (ref 30.0–36.0)
MCV: 81.2 fL (ref 80.0–100.0)
Platelets: 223 K/uL (ref 150–400)
RBC: 3.83 MIL/uL — ABNORMAL LOW (ref 4.22–5.81)
RDW: 12.9 % (ref 11.5–15.5)
WBC: 6 K/uL (ref 4.0–10.5)
nRBC: 0 % (ref 0.0–0.2)

## 2024-06-03 LAB — COMPREHENSIVE METABOLIC PANEL WITH GFR
ALT: 18 U/L (ref 0–44)
AST: 23 U/L (ref 15–41)
Albumin: 4.6 g/dL (ref 3.5–5.0)
Alkaline Phosphatase: 96 U/L (ref 38–126)
Anion gap: 13 (ref 5–15)
BUN: 53 mg/dL — ABNORMAL HIGH (ref 8–23)
CO2: 26 mmol/L (ref 22–32)
Calcium: 9.8 mg/dL (ref 8.9–10.3)
Chloride: 104 mmol/L (ref 98–111)
Creatinine, Ser: 2.76 mg/dL — ABNORMAL HIGH (ref 0.61–1.24)
GFR, Estimated: 25 mL/min — ABNORMAL LOW (ref >60)
Glucose, Bld: 162 mg/dL — ABNORMAL HIGH (ref 70–99)
Potassium: 3.5 mmol/L (ref 3.5–5.1)
Sodium: 143 mmol/L (ref 135–145)
Total Bilirubin: 0.5 mg/dL (ref 0.0–1.2)
Total Protein: 7.7 g/dL (ref 6.5–8.1)

## 2024-06-05 MED ORDER — SODIUM CHLORIDE 0.9 % IV SOLN: INTRAVENOUS | Status: DC

## 2024-06-05 MED ORDER — CEFAZOLIN SODIUM-DEXTROSE 2-4 GM/100ML-% IV SOLN: 2.0000 g | INTRAVENOUS | Status: DC

## 2024-06-05 MED ORDER — CHLORHEXIDINE GLUCONATE 0.12 % MT SOLN
15.0000 mL | Freq: Once | OROMUCOSAL | Status: AC
  Administered 2024-06-06: 15 mL via OROMUCOSAL

## 2024-06-05 MED ORDER — ORAL CARE MOUTH RINSE: 15.0000 mL | Freq: Once | OROMUCOSAL | Status: AC

## 2024-06-06 ENCOUNTER — Ambulatory Visit: Payer: Self-pay | Admitting: Certified Registered"

## 2024-06-06 ENCOUNTER — Ambulatory Visit: Payer: Self-pay | Admitting: Urgent Care

## 2024-06-06 ENCOUNTER — Other Ambulatory Visit: Payer: Self-pay

## 2024-06-06 ENCOUNTER — Encounter
Admission: RE | Disposition: A | Discharge status: Home / Self Care | Payer: Self-pay | Source: Home / Self Care | Attending: Urology

## 2024-06-06 ENCOUNTER — Ambulatory Visit
Admission: RE | Admit: 2024-06-06 | Discharge: 2024-06-06 | Disposition: A | Payer: Self-pay | Attending: Urology | Admitting: Urology

## 2024-06-06 ENCOUNTER — Encounter: Payer: Self-pay | Admitting: Urology

## 2024-06-06 DIAGNOSIS — N1832 Chronic kidney disease, stage 3b: Secondary | ICD-10-CM

## 2024-06-06 DIAGNOSIS — N138 Other obstructive and reflux uropathy: Secondary | ICD-10-CM

## 2024-06-06 HISTORY — PX: HOLEP-LASER ENUCLEATION OF THE PROSTATE WITH MORCELLATION: SHX6641

## 2024-06-06 LAB — GLUCOSE, CAPILLARY
Glucose-Capillary: 153 mg/dL — ABNORMAL HIGH (ref 70–99)
Glucose-Capillary: 185 mg/dL — ABNORMAL HIGH (ref 70–99)

## 2024-06-06 SURGERY — ENUCLEATION, PROSTATE, USING LASER, WITH MORCELLATION
Anesthesia: General | Site: Prostate

## 2024-06-06 MED ORDER — ONDANSETRON HCL 4 MG/2ML IJ SOLN: 4.0000 mg | Freq: Once | INTRAMUSCULAR | Status: DC | PRN

## 2024-06-06 MED ORDER — OXYCODONE HCL 5 MG PO TABS
ORAL_TABLET | ORAL | Status: AC
  Filled 2024-06-06: qty 1

## 2024-06-06 MED ORDER — OXYCODONE HCL 5 MG PO TABS
5.0000 mg | ORAL_TABLET | Freq: Once | ORAL | Status: AC | PRN
  Administered 2024-06-06: 5 mg via ORAL

## 2024-06-06 MED ORDER — FENTANYL CITRATE (PF) 100 MCG/2ML IJ SOLN
INTRAMUSCULAR | Status: DC | PRN
  Administered 2024-06-06 (×2): 50 ug via INTRAVENOUS

## 2024-06-06 MED ORDER — CEFAZOLIN SODIUM-DEXTROSE 2-4 GM/100ML-% IV SOLN
INTRAVENOUS | Status: AC
  Filled 2024-06-06: qty 100

## 2024-06-06 MED ORDER — CEFAZOLIN SODIUM-DEXTROSE 2-3 GM-%(50ML) IV SOLR
INTRAVENOUS | Status: DC | PRN
  Administered 2024-06-06: 2 g via INTRAVENOUS

## 2024-06-06 MED ORDER — FENTANYL CITRATE (PF) 100 MCG/2ML IJ SOLN
INTRAMUSCULAR | Status: AC
  Filled 2024-06-06: qty 2

## 2024-06-06 MED ORDER — SUCCINYLCHOLINE CHLORIDE 200 MG/10ML IV SOSY
PREFILLED_SYRINGE | INTRAVENOUS | Status: DC | PRN
  Administered 2024-06-06: 100 mg via INTRAVENOUS

## 2024-06-06 MED ORDER — SUCCINYLCHOLINE CHLORIDE 200 MG/10ML IV SOSY
PREFILLED_SYRINGE | INTRAVENOUS | Status: AC
  Filled 2024-06-06: qty 30

## 2024-06-06 MED ORDER — LACTATED RINGERS IV SOLN: INTRAVENOUS | Status: DC

## 2024-06-06 MED ORDER — ACETAMINOPHEN 10 MG/ML IV SOLN
INTRAVENOUS | Status: DC | PRN
  Administered 2024-06-06: 1000 mg via INTRAVENOUS

## 2024-06-06 MED ORDER — SUGAMMADEX SODIUM 200 MG/2ML IV SOLN
INTRAVENOUS | Status: DC | PRN
  Administered 2024-06-06: 200 mg via INTRAVENOUS

## 2024-06-06 MED ORDER — FENTANYL CITRATE (PF) 100 MCG/2ML IJ SOLN
25.0000 ug | INTRAMUSCULAR | Status: DC | PRN
  Administered 2024-06-06: 50 ug via INTRAVENOUS

## 2024-06-06 MED ORDER — TRAMADOL HCL 50 MG PO TABS: 25.0000 mg | ORAL_TABLET | Freq: Four times a day (QID) | ORAL | 0 refills | Status: AC | PRN

## 2024-06-06 MED ORDER — OXYCODONE HCL 5 MG/5ML PO SOLN: 5.0000 mg | Freq: Once | ORAL | Status: AC | PRN

## 2024-06-06 MED ORDER — MIDAZOLAM HCL 2 MG/2ML IJ SOLN
INTRAMUSCULAR | Status: AC
  Filled 2024-06-06: qty 2

## 2024-06-06 MED ORDER — ONDANSETRON HCL 4 MG/2ML IJ SOLN
INTRAMUSCULAR | Status: AC
  Filled 2024-06-06: qty 6

## 2024-06-06 MED ORDER — SODIUM CHLORIDE 0.9 % IR SOLN
Status: DC | PRN
  Administered 2024-06-06: 15000 mL
  Administered 2024-06-06: 6000 mL

## 2024-06-06 MED ORDER — ROCURONIUM BROMIDE 100 MG/10ML IV SOLN
INTRAVENOUS | Status: DC | PRN
  Administered 2024-06-06: 10 mg via INTRAVENOUS
  Administered 2024-06-06: 60 mg via INTRAVENOUS

## 2024-06-06 MED ORDER — STERILE WATER FOR IRRIGATION IR SOLN
Status: DC | PRN
  Administered 2024-06-06: 1000 mL

## 2024-06-06 MED ORDER — DEXAMETHASONE SOD PHOSPHATE PF 10 MG/ML IJ SOLN
INTRAMUSCULAR | Status: DC | PRN
  Administered 2024-06-06: 5 mg via INTRAVENOUS

## 2024-06-06 MED ORDER — CHLORHEXIDINE GLUCONATE 0.12 % MT SOLN
OROMUCOSAL | Status: AC
  Filled 2024-06-06: qty 15

## 2024-06-06 MED ORDER — PROPOFOL 10 MG/ML IV BOLUS
INTRAVENOUS | Status: DC | PRN
  Administered 2024-06-06: 200 mg via INTRAVENOUS

## 2024-06-06 MED ORDER — LIDOCAINE HCL (CARDIAC) PF 100 MG/5ML IV SOSY
PREFILLED_SYRINGE | INTRAVENOUS | Status: DC | PRN
  Administered 2024-06-06: 100 mg via INTRAVENOUS

## 2024-06-06 MED ORDER — DEXMEDETOMIDINE HCL IN NACL 200 MCG/50ML IV SOLN
INTRAVENOUS | Status: DC | PRN
  Administered 2024-06-06: 12 ug via INTRAVENOUS

## 2024-06-06 MED ORDER — GLYCOPYRROLATE 0.2 MG/ML IJ SOLN
INTRAMUSCULAR | Status: DC | PRN
  Administered 2024-06-06: .2 mg via INTRAVENOUS

## 2024-06-06 MED ORDER — CIPROFLOXACIN IN D5W 400 MG/200ML IV SOLN
INTRAVENOUS | Status: AC
  Filled 2024-06-06: qty 200

## 2024-06-06 MED ORDER — ONDANSETRON HCL 4 MG/2ML IJ SOLN
INTRAMUSCULAR | Status: DC | PRN
  Administered 2024-06-06 (×2): 4 mg via INTRAVENOUS

## 2024-06-06 MED ORDER — CIPROFLOXACIN IN D5W 400 MG/200ML IV SOLN
400.0000 mg | Freq: Once | INTRAVENOUS | Status: AC
  Administered 2024-06-06: 400 mg via INTRAVENOUS

## 2024-06-06 MED ORDER — ACETAMINOPHEN 10 MG/ML IV SOLN
INTRAVENOUS | Status: AC
  Filled 2024-06-06: qty 100

## 2024-06-06 SURGICAL SUPPLY — 24 items
ADAPTER IRRIG TUBE 2 SPIKE SOL (ADAPTER) ×2 IMPLANT
BAG URO DRAIN 4000ML (MISCELLANEOUS) ×1 IMPLANT
CATH URETL OPEN END 4X70 (CATHETERS) ×1 IMPLANT
CATH URTH STD 24FR FL 3W 2 (CATHETERS) ×1 IMPLANT
CONTAINER COLLECT MORCELLATR (MISCELLANEOUS) ×1 IMPLANT
DRAPE UTILITY 15X26 TOWEL STRL (DRAPES) IMPLANT
FIBER LASER MOSES 550 DFL (Laser) ×1 IMPLANT
FILTER OVERFLOW MORCELLATOR (FILTER) ×1 IMPLANT
GLOVE BIOGEL PI IND STRL 7.5 (GLOVE) ×2 IMPLANT
GOWN STRL REUS W/ TWL LRG LVL3 (GOWN DISPOSABLE) ×1 IMPLANT
GOWN STRL REUS W/ TWL XL LVL3 (GOWN DISPOSABLE) ×1 IMPLANT
HOLDER FOLEY CATH W/STRAP (MISCELLANEOUS) ×1 IMPLANT
KIT TURNOVER CYSTO (KITS) ×1 IMPLANT
MEMBRANE SLNG YLW 17 FOR INST (MISCELLANEOUS) ×1 IMPLANT
MORCELLATOR ROTATION 4.75 335 (MISCELLANEOUS) ×1 IMPLANT
PACK CYSTO AR (MISCELLANEOUS) ×1 IMPLANT
SET CYSTO IRRIGATION (SET/KITS/TRAYS/PACK) ×1 IMPLANT
SET IRRIG Y-TYPE CYSTO (SET/KITS/TRAYS/PACK) ×1 IMPLANT
SLEEVE PROTECTION STRL DISP (MISCELLANEOUS) ×2 IMPLANT
SOL .9 NS 3000ML IRR UROMATIC (IV SOLUTION) ×6 IMPLANT
SOLN STERILE WATER BTL 1000 ML (IV SOLUTION) ×1 IMPLANT
SURGILUBE 2OZ TUBE FLIPTOP (MISCELLANEOUS) ×1 IMPLANT
SYRINGE TOOMEY IRRIG 70ML (MISCELLANEOUS) ×1 IMPLANT
TUBE PUMP MORCELLATOR PIRANHA (TUBING) ×1 IMPLANT

## 2024-06-06 NOTE — Anesthesia Procedure Notes (Signed)
 Procedure Name: Intubation Date/Time: 06/06/2024 10:14 AM  Performed by: Ledora Duncan, CRNAPre-anesthesia Checklist: Patient identified, Emergency Drugs available, Suction available and Patient being monitored Patient Re-evaluated:Patient Re-evaluated prior to induction Oxygen Delivery Method: Circle system utilized Preoxygenation: Pre-oxygenation with 100% oxygen Induction Type: IV induction Ventilation: Mask ventilation without difficulty Laryngoscope Size: McGrath and 3 Grade View: Grade I Tube type: Oral Tube size: 7.0 mm Number of attempts: 1 Airway Equipment and Method: Stylet Placement Confirmation: ETT inserted through vocal cords under direct vision, positive ETCO2 and breath sounds checked- equal and bilateral Secured at: 21 cm Tube secured with: Tape Dental Injury: Teeth and Oropharynx as per pre-operative assessment

## 2024-06-06 NOTE — H&P (Signed)
   06/06/24 10:04 AM   Wesley Fields [id number] [id number]  CC: BPH, low risk prostate cancer, retention  HPI: 63 year old male previously followed by Dr. Penne, transferred care to me after she left the practice.  Has diagnosis of low risk prostate cancer on surveillance with stable MRI findings, develop new urinary retention felt to be related to BPH and he opted for HOLEP.  Risks and benefits were discussed extensively including alternatives like robotic radical prostatectomy, and he opted for HOLEP.    PMH: Past Medical History:  Diagnosis Date   Cirrhosis (HCC)    CKD (chronic kidney disease) stage 3, GFR 30-59 ml/min (HCC)    Complication of anesthesia    disoriented for longer than normal after anesthesia   Diabetes mellitus without complication (HCC)    GERD (gastroesophageal reflux disease)    Hypertension     Surgical History: Past Surgical History:  Procedure Laterality Date   CLUB FOOT RELEASE     GALLBLADDER SURGERY       Family History: Family History  Problem Relation Age of Onset   Prostate cancer Neg Hx    Bladder Cancer Neg Hx    Kidney cancer Neg Hx     Social History:  reports that he has never smoked. He has never used smokeless tobacco. He reports that he does not drink alcohol and does not use drugs.  Physical Exam: BP (!) 185/92   Pulse 73   Temp (!) 97.1 F (36.2 C) (Temporal)   Resp 16   Ht 6' 2 (1.88 m)   Wt 97.1 kg   SpO2 100%   BMI 27.48 kg/m    Constitutional:  Alert and oriented, No acute distress. Cardiovascular: Regular rate and rhythm Respiratory: Clear to auscultation bilaterally GI: Abdomen is soft, nontender, nondistended, no abdominal masses   Laboratory Data: Culture 11/26 no growth  Assessment & Plan:   63 year old male with low risk prostate cancer on surveillance, stable prostate MRI, new urinary retention with hydronephrosis who opted for HOLEP.  Discussed alternatives like robotic radical prostatectomy and  he prefers HOLEP, risks and benefits reviewed extensively.  We discussed the risks and benefits of HoLEP at length.  The procedure requires general anesthesia and takes 1 to 2 hours, and a holmium laser is used to enucleate the prostate and push this tissue into the bladder.  A morcellator is then used to remove this tissue, which is sent for pathology.  The vast majority(>95%) of patients are able to discharge the same day with a catheter in place for 2 to 3 days, and will follow-up in clinic for a voiding trial.  We specifically discussed the risks of bleeding, infection, retrograde ejaculation, temporary urgency and urge incontinence, very low risk of long-term incontinence, urethral stricture/bladder neck contracture, pathologic evaluation of prostate tissue and possible detection of prostate cancer or other malignancy, and possible need for additional procedures.  HOLEP today  Redell Burnet, MD 06/06/2024  [facility] [address] [place], [place] [address] [phone]

## 2024-06-06 NOTE — Anesthesia Postprocedure Evaluation (Signed)
 Anesthesia Post Note  Patient: Wesley Fields  Procedure(s) Performed: ENUCLEATION, PROSTATE, USING LASER, WITH MORCELLATION (Prostate)  Patient location during evaluation: PACU Anesthesia Type: General Level of consciousness: awake and alert, oriented and patient cooperative Pain management: pain level controlled Vital Signs Assessment: post-procedure vital signs reviewed and stable Respiratory status: spontaneous breathing, nonlabored ventilation and respiratory function stable Cardiovascular status: blood pressure returned to baseline and stable Postop Assessment: adequate PO intake Anesthetic complications: no   No notable events documented.   Last Vitals:  Vitals:   06/06/24 1115 06/06/24 1130  BP: (!) 155/86 (!) 155/91  Pulse: (!) 59 (!) 59  Resp: 13 14  Temp:    SpO2: 100% 96%    Last Pain:  Vitals:   06/06/24 1126  TempSrc:   PainSc: 4                  Emmelia Holdsworth

## 2024-06-06 NOTE — Transfer of Care (Signed)
 Immediate Anesthesia Transfer of Care Note  Patient: Wesley Fields  Procedure(s) Performed: ENUCLEATION, PROSTATE, USING LASER, WITH MORCELLATION (Prostate)  Patient Location: PACU  Anesthesia Type:General  Level of Consciousness: drowsy and patient cooperative  Airway & Oxygen Therapy: Patient Spontanous Breathing and Patient connected to face mask oxygen  Post-op Assessment: Report given to RN and Post -op Vital signs reviewed and stable  Post vital signs: Reviewed and stable  Last Vitals:  Vitals Value Taken Time  BP 163/83 06/06/24 11:01  Temp    Pulse 55 06/06/24 11:03  Resp 13 06/06/24 11:03  SpO2 100 % 06/06/24 11:03  Vitals shown include unfiled device data.  Last Pain:  Vitals:   06/06/24 0923  TempSrc: Temporal  PainSc: 0-No pain         Complications: No notable events documented.

## 2024-06-06 NOTE — Op Note (Signed)
 Date of procedure: 06/06/24  Preoperative diagnosis:  BPH with obstruction Low risk prostate cancer  Postoperative diagnosis:  Same  Procedure: HoLEP (Holmium Laser Enucleation of the Prostate)  Surgeon: Redell Burnet, MD  Anesthesia: General  Complications: None  Intraoperative findings:  Short prostate with obstructive lateral lobes, elevated bladder neck Severe bladder trabeculations, no suspicious lesions Uncomplicated HOLEP, ureteral orifices and verumontanum intact bilaterally at conclusion of case  EBL: Minimal  Specimens: Prostate chips  Enucleation time: 15 minutes  Morcellation time: 6 minutes  Intra-op weight: 36g  Drains: 24 French three-way, 60 cc in balloon  Indication: Wesley Fields is a 63 y.o. patient with prior diagnosis of low risk prostate cancer on active surveillance with stable prostate MRI, new urinary retention felt to be related to BPH with upstream hydronephrosis and worsening renal function.  We discussed options including radical prostatectomy or HoLEP, and ultimately he opted for HOLEP.  After reviewing the management options for treatment, they elected to proceed with the above surgical procedure(s). We have discussed the potential benefits and risks of the procedure, side effects of the proposed treatment, the likelihood of the patient achieving the goals of the procedure, and any potential problems that might occur during the procedure or recuperation.  We specifically discussed the risks of bleeding, infection, hematuria and clot retention, need for additional procedures, possible overnight hospital stay, temporary urgency and incontinence, rare long-term incontinence, and retrograde ejaculation.  Informed consent has been obtained.   Description of procedure:  The patient was taken to the operating room and general anesthesia was induced.  The patient was placed in the dorsal lithotomy position, prepped and draped in the usual sterile fashion,  and preoperative antibiotics were administered.  SCDs were placed for DVT prophylaxis.  A preoperative time-out was performed.   Fleeta Needs sounds were used to gently dilated the urethra up to 2F. The 68 French continuous flow resectoscope was inserted into the urethra using the visual obturator  The prostate was short and tight with an elevated bladder neck. The bladder was thoroughly inspected and notable for severe bladder trabeculations, distended bladder.  The ureteral orifices were located in orthotopic position.    The laser was set to 2 J and 60 Hz and early apical release was performed by making a circumferential mucosal incision proximal to the sphincter.  A lambda incision was then made proximal to the verumontanum.  The prostate was enucleated en bloc circumferentially into the bladder.  The capsule was examined and laser was used for meticulous hemostasis.    The 53 French resectoscope was then switched out for the 26 French nephroscope and prostate tissue was morcellated(Piranha) and the tissue sent to pathology.  A 24 French three-way catheter was inserted easily with the aid of a catheter guide, and 60 cc were placed in the balloon.  Urine was faint pink.  The catheter irrigated easily with a Toomey syringe.  CBI was initiated.   The patient tolerated the procedure well without any immediate complications and was extubated and transferred to the recovery room in stable condition.  Urine was clear on fast CBI.  Disposition: Stable to PACU  Plan: Wean CBI in PACU, anticipate discharge home today with Foley removal in clinic in 2-3 days Follow-up pathology, will need PSA at 2-month visit  Redell Burnet, MD 06/06/2024

## 2024-06-06 NOTE — Anesthesia Preprocedure Evaluation (Addendum)
 Anesthesia Evaluation  Patient identified by MRN, date of birth, ID band Patient awake    Reviewed: Allergy & Precautions, NPO status , Patient's Chart, lab work & pertinent test results  History of Anesthesia Complications Negative for: history of anesthetic complications  Airway Mallampati: III   Neck ROM: Full    Dental no notable dental hx.    Pulmonary neg pulmonary ROS   Pulmonary exam normal breath sounds clear to auscultation       Cardiovascular hypertension, Normal cardiovascular exam Rhythm:Regular Rate:Normal  ECG 06/03/24: Normal sinus rhythm Possible Inferior infarct , age undetermine   Neuro/Psych negative neurological ROS     GI/Hepatic negative GI ROS,,,(+) Cirrhosis         Endo/Other  diabetes, Type 2    Renal/GU Renal disease (stage III CKD)   BPH    Musculoskeletal   Abdominal   Peds  Hematology negative hematology ROS (+)   Anesthesia Other Findings   Reproductive/Obstetrics                              Anesthesia Physical Anesthesia Plan  ASA: 2  Anesthesia Plan: General   Post-op Pain Management:    Induction: Intravenous  PONV Risk Score and Plan: 2 and Ondansetron , Dexamethasone  and Treatment may vary due to age or medical condition  Airway Management Planned: Oral ETT  Additional Equipment:   Intra-op Plan:   Post-operative Plan: Extubation in OR  Informed Consent: I have reviewed the patients History and Physical, chart, labs and discussed the procedure including the risks, benefits and alternatives for the proposed anesthesia with the patient or authorized representative who has indicated his/her understanding and acceptance.     Dental advisory given  Plan Discussed with: CRNA  Anesthesia Plan Comments: (Patient consented for risks of anesthesia including but not limited to:  - adverse reactions to medications - damage to eyes,  teeth, lips or other oral mucosa - nerve damage due to positioning  - sore throat or hoarseness - damage to heart, brain, nerves, lungs, other parts of body or loss of life  Informed patient about role of CRNA in peri- and intra-operative care.  Patient voiced understanding.)         Anesthesia Quick Evaluation

## 2024-06-07 ENCOUNTER — Ambulatory Visit: Payer: Self-pay | Admitting: Urology

## 2024-06-07 ENCOUNTER — Encounter: Payer: Self-pay | Admitting: Urology

## 2024-06-07 LAB — SURGICAL PATHOLOGY

## 2024-06-08 ENCOUNTER — Ambulatory Visit (INDEPENDENT_AMBULATORY_CARE_PROVIDER_SITE_OTHER): Payer: Self-pay | Admitting: Physician Assistant

## 2024-06-08 VITALS — BP 151/101 | HR 102 | Ht 72.0 in | Wt 213.0 lb

## 2024-06-08 DIAGNOSIS — C61 Malignant neoplasm of prostate: Secondary | ICD-10-CM

## 2024-06-08 DIAGNOSIS — N401 Enlarged prostate with lower urinary tract symptoms: Secondary | ICD-10-CM

## 2024-06-08 DIAGNOSIS — N138 Other obstructive and reflux uropathy: Secondary | ICD-10-CM

## 2024-06-08 MED ORDER — SULFAMETHOXAZOLE-TRIMETHOPRIM 800-160 MG PO TABS
1.0000 | ORAL_TABLET | Freq: Once | ORAL | Status: AC
  Administered 2024-06-08: 1 via ORAL

## 2024-06-08 NOTE — Patient Instructions (Signed)

## 2024-06-08 NOTE — Progress Notes (Signed)
 Catheter Removal  Patient is present today for a catheter removal.  59ml of water  was drained from the balloon. A 24FR three-way foley cath was removed from the bladder, no complications were noted. Patient tolerated well.  Performed by: Vernida Mcnicholas, PA-C   Additional notes: Counseled patient on normal postoperative findings including dysuria, gross hematuria, and urinary urgency/leakage. Counseled patient to begin Kegel exercises 3x10 sets daily to increase urinary control and wear absorbent products as needed for security. Written and verbal resources provided today. Surgical pathology with intermediate favorable risk prostate cancer (prior dx low risk); we discussed results and plan for PSA monitoring with very low chance of requiring further treatment.  Bactrim DS x1 dose administered prior to Foley removal as above.   Follow up: Return in about 4 months (around 10/07/2024) for Postop f/u with Dr. Francisca with PSA prior.

## 2024-06-10 ENCOUNTER — Ambulatory Visit: Payer: Self-pay | Admitting: Physician Assistant

## 2024-06-10 DIAGNOSIS — C61 Malignant neoplasm of prostate: Secondary | ICD-10-CM

## 2024-06-10 DIAGNOSIS — N401 Enlarged prostate with lower urinary tract symptoms: Secondary | ICD-10-CM

## 2024-06-10 DIAGNOSIS — N138 Other obstructive and reflux uropathy: Secondary | ICD-10-CM

## 2024-06-10 LAB — MICROSCOPIC EXAMINATION: RBC, Urine: >30 /HPF — AB (ref 0–2)

## 2024-06-10 LAB — URINALYSIS, COMPLETE
Bilirubin, UA: NEGATIVE
Ketones, UA: NEGATIVE
Nitrite, UA: NEGATIVE
Specific Gravity, UA: 1.02 (ref 1.005–1.030)
Urobilinogen, Ur: 0.2 mg/dL (ref 0.2–1.0)
pH, UA: 6 (ref 5.0–7.5)

## 2024-06-10 LAB — BLADDER SCAN AMB NON-IMAGING: Scan Result: 233

## 2024-06-10 NOTE — Progress Notes (Signed)
 Pt came in today for burning, frequency and urgency. Pt is post op day 5 from HOLEP. We preformed a UA and culture and bladder scan. PVR was 233 ml; UA was consistent with his postop status. Pt sent home and told to keep taking his flomax  and tylenol  to help with post op discomfort.

## 2024-06-14 ENCOUNTER — Ambulatory Visit: Payer: Self-pay | Admitting: Physician Assistant

## 2024-06-14 LAB — CULTURE, URINE COMPREHENSIVE

## 2024-06-15 NOTE — Telephone Encounter (Signed)
 1st phone call attempted. Detailed voicemail left. Clinic telephone number provided for any additional questions or concerns.

## 2024-06-15 NOTE — Telephone Encounter (Signed)
-----   Message from Lucie Hones sent at 06/14/2024  5:30 PM EST ----- Great news, urine culture negative.  No antibiotics needed.

## 2024-08-31 ENCOUNTER — Other Ambulatory Visit: Payer: Self-pay

## 2024-09-07 ENCOUNTER — Ambulatory Visit: Payer: Self-pay | Admitting: Urology
